# Patient Record
Sex: Female | Born: 1955
Health system: Southern US, Community
[De-identification: ages and names within clinical notes are randomized; demographics above are authoritative.]

## PROBLEM LIST (undated history)

## (undated) DIAGNOSIS — T753XXA Motion sickness, initial encounter: Secondary | ICD-10-CM

## (undated) DIAGNOSIS — R002 Palpitations: Secondary | ICD-10-CM

## (undated) DIAGNOSIS — M1990 Unspecified osteoarthritis, unspecified site: Secondary | ICD-10-CM

## (undated) DIAGNOSIS — M810 Age-related osteoporosis without current pathological fracture: Secondary | ICD-10-CM

## (undated) HISTORY — PX: COLONOSCOPY: SHX174

---
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
## 1985-02-13 HISTORY — PX: TUBAL LIGATION: SHX77

## 1998-08-19 ENCOUNTER — Other Ambulatory Visit: Admission: RE | Admit: 1998-08-19 | Discharge: 1998-08-19 | Payer: Self-pay | Admitting: Obstetrics and Gynecology

## 1999-09-01 ENCOUNTER — Other Ambulatory Visit: Admission: RE | Admit: 1999-09-01 | Discharge: 1999-09-01 | Payer: Self-pay | Admitting: Obstetrics and Gynecology

## 2000-11-07 ENCOUNTER — Other Ambulatory Visit: Admission: RE | Admit: 2000-11-07 | Discharge: 2000-11-07 | Payer: Self-pay | Admitting: Obstetrics and Gynecology

## 2001-09-03 ENCOUNTER — Other Ambulatory Visit: Admission: RE | Admit: 2001-09-03 | Discharge: 2001-09-03 | Payer: Self-pay | Admitting: Obstetrics and Gynecology

## 2002-12-17 ENCOUNTER — Other Ambulatory Visit: Admission: RE | Admit: 2002-12-17 | Discharge: 2002-12-17 | Payer: Self-pay | Admitting: Obstetrics and Gynecology

## 2004-02-29 ENCOUNTER — Other Ambulatory Visit: Admission: RE | Admit: 2004-02-29 | Discharge: 2004-02-29 | Payer: Self-pay | Admitting: Obstetrics and Gynecology

## 2004-10-25 ENCOUNTER — Other Ambulatory Visit: Admission: RE | Admit: 2004-10-25 | Discharge: 2004-10-25 | Payer: Self-pay | Admitting: Obstetrics and Gynecology

## 2006-11-01 ENCOUNTER — Ambulatory Visit: Payer: Self-pay | Admitting: Obstetrics & Gynecology

## 2012-06-27 ENCOUNTER — Ambulatory Visit: Payer: Self-pay | Admitting: Obstetrics and Gynecology

## 2013-12-01 ENCOUNTER — Ambulatory Visit: Payer: Self-pay | Admitting: Obstetrics and Gynecology

## 2013-12-02 ENCOUNTER — Encounter: Payer: Self-pay | Admitting: *Deleted

## 2013-12-17 ENCOUNTER — Encounter: Payer: Self-pay | Admitting: General Surgery

## 2013-12-17 ENCOUNTER — Ambulatory Visit (INDEPENDENT_AMBULATORY_CARE_PROVIDER_SITE_OTHER): Payer: BC Managed Care – PPO | Admitting: General Surgery

## 2013-12-17 VITALS — BP 116/68 | HR 72 | Resp 12 | Ht 61.0 in | Wt 123.0 lb

## 2013-12-17 DIAGNOSIS — N63 Unspecified lump in breast: Secondary | ICD-10-CM

## 2013-12-17 DIAGNOSIS — N6320 Unspecified lump in the left breast, unspecified quadrant: Secondary | ICD-10-CM

## 2013-12-17 NOTE — Progress Notes (Signed)
Patient ID: Natasha KocherNancy L Godsil, female   DOB: 08/24/1955, 58 y.o.   MRN: [id number]  Chief Complaint  Patient presents with  . Other    left breast cyst    HPI Natasha Kocherancy L Mccleod is a 58 y.o. female who presents for a breast evaluation. The most recent mammogram was done on 12/01/13 and left breast ultrasound was performed on 12/05/13.  Patient does not  perform regular self breast checks and gets regular mammograms done.   The patient has been making use of Activella to manage vasomotor symptoms with significant relief for the past year.  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  HPI  No past medical history on file.  Past Surgical History  Procedure Laterality Date  . Tubal ligation  1987  . Tonsillectomy  1972    No family history on file.  Social History History  Substance Use Topics  . Smoking status: Never Smoker   . Smokeless tobacco: Never Used  . Alcohol Use: 0.0 oz/week    0 Not specified per week    Allergies  Allergen Reactions  . Penicillins Hives    Current Outpatient Prescriptions  Medication Sig Dispense Refill  . Calcium Carbonate-Vit D-Min (CALCIUM 1200 PO) Take 1 capsule by mouth daily.    . Cholecalciferol (VITAMIN D3) 2000 UNITS CHEW Chew by mouth daily.    Marland Kitchen. co-enzyme Q-10 30 MG capsule Take 30 mg by mouth 3 (three) times daily.    . Estradiol-Norethindrone Acet (ACTIVELLA) 0.5-0.1 MG per tablet Take 1 tablet by mouth daily.    . Omega-3 Fatty Acids (FISH OIL) 1000 MG CAPS Take 1 capsule by mouth daily.    . Red Yeast Rice 600 MG CAPS Take 1 capsule by mouth daily.    . Vitamin D, Cholecalciferol, 1000 UNITS TABS Take by mouth daily.     No current facility-administered medications for this visit.    Review of Systems Review of Systems  Constitutional: Negative.   Respiratory: Negative.   Cardiovascular: Negative.     Blood pressure 116/68, pulse 72, resp. rate 12, height 5\' 1"  (1.549 m), weight 123 lb (55.792 kg).  Physical  Exam Physical Exam  Constitutional: She is oriented to person, place, and time. She appears well-developed and well-nourished.  Eyes: Conjunctivae are normal. No scleral icterus.  Neck: Neck supple.  Cardiovascular: Normal rate, regular rhythm and normal heart sounds.   Pulmonary/Chest: Effort normal and breath sounds normal. Right breast exhibits no inverted nipple, no mass, no nipple discharge, no skin change and no tenderness. Left breast exhibits no inverted nipple, no mass, no nipple discharge, no skin change and no tenderness. Breasts are asymmetrical ( left breast bigger than right ).  Lymphadenopathy:    She has no cervical adenopathy.    She has no axillary adenopathy.  Neurological: She is alert and oriented to person, place, and time.  Skin: Skin is warm and dry.    Data Reviewed [facility]2013 showed dense breast and a suggestion of a new nodule in the 3:00 position of the left breast.  Focal spot compression views and ultrasound of the left breast dated 12/01/2013 completed at [facility] showed a circumscribed, oval mass in the left breast. Ultrasound showed a 4 x 8 x 7 mm cystic structure with thin septations at the 3:00 position, 4 cm from the nipple felt to represent a complex cyst and corresponding to the mammographic abnormality. A follow up mammogram and ultrasound in 6  months were recommended.  The area is much better seen on ultrasound and on mammogram.  Assessment    Complex cyst on imaging, low suspicion for malignancy.     Plan    Options for management were reviewed: 1) aspiration to confirm ultrasound impression versus 2) 6 month follow-up exam as recommended by the radiology staff. At this time, the patient is comfortable with observation.  She was offered to have the study completed at the hospital with an office visit here to follow or to have the ultrasound completed in office. She has chosen the latter. Patient to  return in six month at which time a left breast ultrasound will be completed.   I see no contraindication of the continued use of her estrogen supplement as it has produced significant relief from vasomotor symptoms.   PCP:  Alyson ReedyKing, Crystal M    Phong Isenberg W 12/19/2013, 8:53 AM

## 2013-12-17 NOTE — Patient Instructions (Signed)
Patient to return in six month with a left breast ultrasound.

## 2013-12-19 DIAGNOSIS — N6320 Unspecified lump in the left breast, unspecified quadrant: Secondary | ICD-10-CM | POA: Insufficient documentation

## 2013-12-29 ENCOUNTER — Ambulatory Visit: Payer: Self-pay | Admitting: Obstetrics and Gynecology

## 2014-05-28 ENCOUNTER — Ambulatory Visit: Payer: Self-pay | Admitting: General Surgery

## 2014-06-02 ENCOUNTER — Encounter: Payer: Self-pay | Admitting: General Surgery

## 2014-06-02 ENCOUNTER — Ambulatory Visit (INDEPENDENT_AMBULATORY_CARE_PROVIDER_SITE_OTHER): Payer: BLUE CROSS/BLUE SHIELD | Admitting: General Surgery

## 2014-06-02 ENCOUNTER — Other Ambulatory Visit: Payer: BLUE CROSS/BLUE SHIELD

## 2014-06-02 VITALS — BP 130/72 | HR 76 | Resp 12 | Ht 61.0 in | Wt 134.0 lb

## 2014-06-02 DIAGNOSIS — N63 Unspecified lump in breast: Secondary | ICD-10-CM

## 2014-06-02 DIAGNOSIS — N6002 Solitary cyst of left breast: Secondary | ICD-10-CM

## 2014-06-02 DIAGNOSIS — N6320 Unspecified lump in the left breast, unspecified quadrant: Secondary | ICD-10-CM

## 2014-06-02 DIAGNOSIS — N6001 Solitary cyst of right breast: Secondary | ICD-10-CM | POA: Insufficient documentation

## 2014-06-02 NOTE — Progress Notes (Signed)
Patient ID: Natasha Andrews, female   DOB: 04/28/1955, 59 y.o.   MRN: [id number]  Chief Complaint  Patient presents with  . Follow-up    left breast mass    HPI Natasha Andrews is a 59 y.o. female here following up for left breast ultrasound of a nodular area identified on her fall 2015 mammograms. The patient reports no difficulties with her breasts. HPI  No past medical history on file.  Past Surgical History  Procedure Laterality Date  . Tubal ligation  1987  . Tonsillectomy  1972    No family history on file.  Social History History  Substance Use Topics  . Smoking status: Never Smoker   . Smokeless tobacco: Never Used  . Alcohol Use: 0.0 oz/week    0 Standard drinks or equivalent per week    Allergies  Allergen Reactions  . Penicillins Hives    Current Outpatient Prescriptions  Medication Sig Dispense Refill  . Calcium Carbonate-Vit D-Min (CALCIUM 1200 PO) Take 1 capsule by mouth daily.    . Cholecalciferol (VITAMIN D3) 2000 UNITS CHEW Chew by mouth daily.    Marland Kitchen co-enzyme Q-10 30 MG capsule Take 30 mg by mouth 3 (three) times daily.    . Estradiol-Norethindrone Acet (ACTIVELLA) 0.5-0.1 MG per tablet Take 1 tablet by mouth daily.    . Omega-3 Fatty Acids (FISH OIL) 1000 MG CAPS Take 1 capsule by mouth daily.    . Red Yeast Rice 600 MG CAPS Take 1 capsule by mouth daily.    . Vitamin D, Cholecalciferol, 1000 UNITS TABS Take by mouth daily.     No current facility-administered medications for this visit.    Review of Systems Review of Systems  Constitutional: Negative.   Respiratory: Negative.   Cardiovascular: Negative.     Blood pressure 130/72, pulse 76, resp. rate 12, height  (1.549 m), weight 134 lb (60.782 kg).  Physical Exam Physical Exam  Constitutional: She is oriented to person, place, and time. She appears well-developed and well-nourished.  Eyes: Conjunctivae are normal. No scleral icterus.  Neck: Neck supple.  Cardiovascular: Normal rate,  regular rhythm and normal heart sounds.   Pulmonary/Chest: Effort normal and breath sounds normal. Right breast exhibits no inverted nipple, no mass, no nipple discharge, no skin change and no tenderness. Left breast exhibits no inverted nipple, no mass, no nipple discharge, no skin change and no tenderness. Breasts are asymmetrical (left breast larger than right).  Lymphadenopathy:    She has no cervical adenopathy.    She has no axillary adenopathy.  Neurological: She is alert and oriented to person, place, and time.  Skin: Skin is warm and dry.    Data Review  Prior ultrasound in November 2015 had shown a 4 x 7 x 8 cm irregular multilobulated nodule thought to represent a complex cyst.  Ultrasound examination of the left breast at the 3:00 position, 4 cm from the nipple showed a multilobulated complex cystic structure now measuring 0.6 x 0.78 x 0.8 cm. With the modest increase in size over time aspiration was recommended and accepted. Using 1 mL of 1% plain Xylocaine the area was aspirated with complete resolution. Due to the hyperechoic tissue surrounding the area slides 4 were prepared for cytologic review. BI-RADS-3.  Assessment    benign breast exam, complex cyst resolved on aspiration.    Plan    The patient will be notified when cytologic review is completed.   We will plan for follow-up screening mammograms in  6 months at [facility].    PCP: Thad RangerKing, Crystal NP   Earline MayotteByrnett, Daya Dutt W 06/02/2014, 8:27 PM

## 2014-06-02 NOTE — Patient Instructions (Signed)
Continue self breast exams. Call office for any new breast issues or concerns. 

## 2014-06-03 NOTE — Addendum Note (Signed)
Addended by: Currie ParisHATCH, Caidence Kaseman M on: 06/03/2014 11:15 AM   Modules accepted: Level of Service

## 2014-06-05 LAB — FINE-NEEDLE ASPIRATION

## 2014-06-08 ENCOUNTER — Telehealth: Payer: Self-pay | Admitting: General Surgery

## 2014-06-08 NOTE — Telephone Encounter (Signed)
The patient was notified that the cytology reports from last week cyst aspiration showed some atypical cells. I suspect that these are more likely inflammatory and anything else based on the hyperemic tissue around the cyst itself. We'll need to do a core biopsy of this area, and I proposed that we do this in about 3-4 weeks. We'll need to have a tiny bit of fluid reaccumulated to see where the area was, as there was complete resolution on aspiration.  She is comfortable with the idea and she'll be contacted for follow-up.

## 2014-06-30 ENCOUNTER — Other Ambulatory Visit: Payer: BLUE CROSS/BLUE SHIELD

## 2014-06-30 ENCOUNTER — Ambulatory Visit (INDEPENDENT_AMBULATORY_CARE_PROVIDER_SITE_OTHER): Payer: BLUE CROSS/BLUE SHIELD | Admitting: General Surgery

## 2014-06-30 ENCOUNTER — Encounter: Payer: Self-pay | Admitting: General Surgery

## 2014-06-30 VITALS — BP 124/66 | HR 80 | Resp 12 | Ht 62.0 in | Wt 122.0 lb

## 2014-06-30 DIAGNOSIS — N6320 Unspecified lump in the left breast, unspecified quadrant: Secondary | ICD-10-CM

## 2014-06-30 DIAGNOSIS — N63 Unspecified lump in breast: Secondary | ICD-10-CM

## 2014-06-30 HISTORY — PX: BREAST SURGERY: SHX581

## 2014-06-30 HISTORY — PX: BREAST BIOPSY: SHX20

## 2014-06-30 NOTE — Patient Instructions (Signed)

## 2014-06-30 NOTE — Progress Notes (Signed)
Patient ID: Natasha KocherNancy L Andrews, female   DOB: 08/16/1955, 59 y.o.   MRN: [id number]  Chief Complaint  Patient presents with  . Other    Vaccum biopsy of left breast    HPI Natasha Kocherancy L Reddix is a 59 y.o. female here today for left breast biopsy. HPI   No past medical history on file.  Past Surgical History  Procedure Laterality Date  . Tubal ligation  1987  . Tonsillectomy  1972    No family history on file.  Social History History  Substance Use Topics  . Smoking status: Never Smoker   . Smokeless tobacco: Never Used  . Alcohol Use: 0.0 oz/week    0 Standard drinks or equivalent per week    Allergies  Allergen Reactions  . Penicillins Hives    Current Outpatient Prescriptions  Medication Sig Dispense Refill  . ALPRAZolam (XANAX) 0.25 MG tablet Take 0.25 mg by mouth 2 (two) times daily as needed.   0  . Calcium Carbonate-Vit D-Min (CALCIUM 1200 PO) Take 1 capsule by mouth daily.    . Cholecalciferol (VITAMIN D3) 2000 UNITS CHEW Chew by mouth daily.    Marland Kitchen. co-enzyme Q-10 30 MG capsule Take 30 mg by mouth 3 (three) times daily.    . Estradiol-Norethindrone Acet (ACTIVELLA) 0.5-0.1 MG per tablet Take 1 tablet by mouth daily.    . Omega-3 Fatty Acids (FISH OIL) 1000 MG CAPS Take 1 capsule by mouth daily.    . Red Yeast Rice 600 MG CAPS Take 1 capsule by mouth daily.     No current facility-administered medications for this visit.    Review of Systems Review of Systems  Constitutional: Negative.   Respiratory: Negative.   Cardiovascular: Negative.     Blood pressure 124/66, pulse 80, resp. rate 12, height 5\' 2"  (1.575 m), weight 122 lb (55.339 kg).  Physical Exam Physical Exam  Pulmonary/Chest:    Genitourinary: No breast swelling, tenderness or discharge. Pelvic exam was performed with patient supine.    Data Reviewed Ultrasound examination of the left breast at the 3:00 position, 4 cm from the nipple had previously shown a multilobulated complex cystic structure now  measuring 0.6 x 0.78 x 0.8 cm. Cytology from this aspiration had shown atypical cells.  Vacuum biopsy was completed at the 3:00 position of the left breast 4 cm from the nipple.  10 mL of 0.5% Xylocaine with 0.25% Marcaine with 1-200,000 units of epinephrine was utilized well tolerated.  Under ultrasound guidance 12 core samples were obtained with complete resolution of the hyperechoic tissue surrounding the original cystic lesion. A postbiopsy clip was placed. Scant bleeding was noted. Skin defect was closed with benzoin and Steri-Strip. Telfa and Tegaderm dressing applied. Procedure was well tolerated.  Assessment    Atypical ductal cells on aspiration.     Plan    The patient will be contacted when biopsy results are available. Postbiopsy wound care reviewed with the patient, written instructions provided.        PCP: No PCP REF: Diego CoryCarrie Klett,MD  Garritt Molyneux W 06/30/2014, 9:56 AM

## 2014-07-01 ENCOUNTER — Telehealth: Payer: Self-pay | Admitting: *Deleted

## 2014-07-01 NOTE — Telephone Encounter (Signed)
Phone call from Dr Luisa HartPatrick, pathology, recent left breast biopsy showed fibrocystic changes with calcifications.

## 2014-07-01 NOTE — Telephone Encounter (Signed)
Notified patient as instructed, patient pleased. Discussed follow-up appointments, patient agrees  

## 2014-07-07 ENCOUNTER — Ambulatory Visit: Payer: BLUE CROSS/BLUE SHIELD | Admitting: *Deleted

## 2014-07-07 DIAGNOSIS — N6320 Unspecified lump in the left breast, unspecified quadrant: Secondary | ICD-10-CM

## 2014-07-07 NOTE — Progress Notes (Signed)
Patient came in today for a wound check/post left breast biopsy.  The wound is clean, with no signs of infection noted. Follow up as scheduled.  

## 2014-12-09 ENCOUNTER — Ambulatory Visit: Payer: BLUE CROSS/BLUE SHIELD | Admitting: General Surgery

## 2014-12-16 ENCOUNTER — Ambulatory Visit: Payer: BLUE CROSS/BLUE SHIELD | Admitting: General Surgery

## 2014-12-30 ENCOUNTER — Ambulatory Visit: Payer: BLUE CROSS/BLUE SHIELD | Admitting: General Surgery

## 2015-01-13 ENCOUNTER — Encounter: Payer: Self-pay | Admitting: General Surgery

## 2015-01-13 ENCOUNTER — Ambulatory Visit (INDEPENDENT_AMBULATORY_CARE_PROVIDER_SITE_OTHER): Payer: BLUE CROSS/BLUE SHIELD | Admitting: General Surgery

## 2015-01-13 VITALS — BP 98/58 | HR 72 | Resp 12 | Ht 64.0 in | Wt 121.0 lb

## 2015-01-13 DIAGNOSIS — N6002 Solitary cyst of left breast: Secondary | ICD-10-CM

## 2015-01-13 DIAGNOSIS — N63 Unspecified lump in breast: Secondary | ICD-10-CM

## 2015-01-13 DIAGNOSIS — N6320 Unspecified lump in the left breast, unspecified quadrant: Secondary | ICD-10-CM

## 2015-01-13 NOTE — Progress Notes (Signed)
Patient ID: Natasha Andrews, female   DOB: 06/02/1955, 59 y.o.   MRN: [id number]  Chief Complaint  Patient presents with  . Follow-up    mammogram    HPI Natasha Andrews is a 59 y.o. female who presents for a breast evaluation. The most recent mammogram was done on  11/27/14.  Patient does perform regular self breast checks and gets regular mammograms done.    HPI  No past medical history on file.  Past Surgical History  Procedure Laterality Date  . Tubal ligation  1987  . Tonsillectomy  1972  . Breast surgery Left 06/30/2014    Vacuum assisted biopsy, fibrocystic changes.    No family history on file.  Social History Social History  Substance Use Topics  . Smoking status: Never Smoker   . Smokeless tobacco: Never Used  . Alcohol Use: 0.0 oz/week    0 Standard drinks or equivalent per week    Allergies  Allergen Reactions  . Penicillins Hives    Current Outpatient Prescriptions  Medication Sig Dispense Refill  . ALPRAZolam (XANAX) 0.25 MG tablet Take 0.25 mg by mouth 2 (two) times daily as needed.   0  . Calcium Carbonate-Vit D-Min (CALCIUM 1200 PO) Take 1 capsule by mouth daily.    . Cholecalciferol (VITAMIN D3) 2000 UNITS CHEW Chew by mouth daily.    Marland Kitchen. co-enzyme Q-10 30 MG capsule Take 30 mg by mouth 3 (three) times daily.    . Estradiol-Norethindrone Acet (ACTIVELLA) 0.5-0.1 MG per tablet Take 1 tablet by mouth daily.    . Omega-3 Fatty Acids (FISH OIL) 1000 MG CAPS Take 1 capsule by mouth daily.    . Red Yeast Rice 600 MG CAPS Take 1 capsule by mouth daily.     No current facility-administered medications for this visit.    Review of Systems Review of Systems  Constitutional: Negative.   Respiratory: Negative.   Cardiovascular: Negative.     Blood pressure 98/58, pulse 72, resp. rate 12, height 5\' 4"  (1.626 m), weight 121 lb (54.885 kg).  Physical Exam Physical Exam  Constitutional: She is oriented to person, place, and time. She appears well-developed  and well-nourished.  Eyes: Conjunctivae are normal. No scleral icterus.  Neck: Neck supple.  Cardiovascular: Normal rate, regular rhythm and normal heart sounds.   Pulmonary/Chest: Effort normal and breath sounds normal. Right breast exhibits no inverted nipple, no mass, no nipple discharge, no skin change and no tenderness. Left breast exhibits no inverted nipple, no mass, no nipple discharge, no skin change and no tenderness.  Lymphadenopathy:    She has no cervical adenopathy.    She has no axillary adenopathy.  Neurological: She is alert and oriented to person, place, and time.  Skin: Skin is warm and dry.    Data Reviewed Diagnosis Breast, left, needle core biopsy, 3 o'clock - FIBROCYSTIC CHANGES. - NO EVIDENCE OF MALIGNANCY. - SEE MICROSCOPIC DESCRIPTION. Microscopic Comment The results were called to [facility] on 07/01/2014. (JDP:kh 07/01/14) Jimmy PicketJOHN PATRICK MD Pathologist, Electronic Signature (Case signed 07/01/2014)  Bilateral mammograms dated 11/27/2014 were reviewed. The previous abnormality is resolved, biopsy clip and placed. Pathology benign as noted above. BIRAD-1.Specimen Gross  Assessment    Benign breast exam.     Plan    The patient should continue annual screening mammograms with her OB/GYN provider.     Patient to return as needed. PCP:  No Pcp   Earline MayotteByrnett, Jodel Mayhall W 01/13/2015, 3:12 PM

## 2015-01-13 NOTE — Patient Instructions (Signed)
Patient to return as needed. Continue self breast exams. Call office for any new breast issues or concerns.'  

## 2015-12-08 ENCOUNTER — Telehealth: Payer: Self-pay | Admitting: Gastroenterology

## 2015-12-08 DIAGNOSIS — M810 Age-related osteoporosis without current pathological fracture: Secondary | ICD-10-CM | POA: Diagnosis not present

## 2015-12-08 DIAGNOSIS — Z1329 Encounter for screening for other suspected endocrine disorder: Secondary | ICD-10-CM | POA: Diagnosis not present

## 2015-12-08 DIAGNOSIS — Z13220 Encounter for screening for lipoid disorders: Secondary | ICD-10-CM | POA: Diagnosis not present

## 2015-12-08 DIAGNOSIS — Z01419 Encounter for gynecological examination (general) (routine) without abnormal findings: Secondary | ICD-10-CM | POA: Diagnosis not present

## 2015-12-08 LAB — HM PAP SMEAR: HM Pap smear: NEGATIVE

## 2015-12-08 NOTE — Telephone Encounter (Signed)
colonoscopy

## 2015-12-13 ENCOUNTER — Other Ambulatory Visit: Payer: Self-pay

## 2015-12-13 NOTE — Telephone Encounter (Signed)
Screening Colonoscopy Z12.11 02/11/16 [facility] Dr. Rickey BarbaraWohl BCBS Pre cert is not required

## 2015-12-13 NOTE — Telephone Encounter (Signed)
Gastroenterology Pre-Procedure Review  Request Date: 02/11/2016 Requesting Physician:   PATIENT REVIEW QUESTIONS: The patient responded to the following health history questions as indicated:    1. Are you having any GI issues? no 2. Do you have a personal history of Polyps? no 3. Do you have a family history of Colon Cancer or Polyps? no 4. Diabetes Mellitus? no 5. Joint replacements in the past 12 months?no 6. Major health problems in the past 3 months?no 7. Any artificial heart valves, MVP, or defibrillator?no    MEDICATIONS & ALLERGIES:    Patient reports the following regarding taking any anticoagulation/antiplatelet therapy:   Plavix, Coumadin, Eliquis, Xarelto, Lovenox, Pradaxa, Brilinta, or Effient? no Aspirin? no  Patient confirms/reports the following medications:  Current Outpatient Prescriptions  Medication Sig Dispense Refill  . Calcium Carbonate-Vit D-Min (CALCIUM 1200 PO) Take 1 capsule by mouth daily.    . Cholecalciferol (VITAMIN D3) 2000 UNITS CHEW Chew by mouth daily.    Marland Kitchen. co-enzyme Q-10 30 MG capsule Take 30 mg by mouth 3 (three) times daily.    . Estradiol-Norethindrone Acet (ACTIVELLA) 0.5-0.1 MG per tablet Take 1 tablet by mouth daily.    . Omega-3 Fatty Acids (FISH OIL) 1000 MG CAPS Take 1 capsule by mouth daily.    . Red Yeast Rice 600 MG CAPS Take 1 capsule by mouth daily.    . valACYclovir (VALTREX) 500 MG tablet   4   No current facility-administered medications for this visit.     Patient confirms/reports the following allergies:  Allergies  Allergen Reactions  . Penicillins Hives    No orders of the defined types were placed in this encounter.   AUTHORIZATION INFORMATION Primary Insurance: 1D#: Group #:  Secondary Insurance: 1D#: Group #:  SCHEDULE INFORMATION: Date: 02/11/2016 Time: Location: [facility]

## 2016-01-25 ENCOUNTER — Telehealth: Payer: Self-pay | Admitting: Gastroenterology

## 2016-01-25 NOTE — Telephone Encounter (Signed)
Ms. Natasha Andrews called saying she's scheduled for a Colonoscopy on December 29th, 2017 and she wants to reschedule it to the second week in January. Please give her a phone call regarding this.  Pt's ph# [phone] Thank you.

## 2016-01-26 NOTE — Telephone Encounter (Signed)
Colonoscopy rescheduled to Jan 12th. [facility] notified.

## 2016-02-17 ENCOUNTER — Encounter: Payer: Self-pay | Admitting: *Deleted

## 2016-02-24 NOTE — Discharge Instructions (Signed)

## 2016-02-25 ENCOUNTER — Ambulatory Visit: Payer: BLUE CROSS/BLUE SHIELD | Admitting: Anesthesiology

## 2016-02-25 ENCOUNTER — Encounter
Admission: RE | Disposition: A | Discharge status: Home / Self Care | Payer: Self-pay | Source: Ambulatory Visit | Attending: Gastroenterology

## 2016-02-25 ENCOUNTER — Ambulatory Visit
Admission: RE | Admit: 2016-02-25 | Discharge: 2016-02-25 | Disposition: A | Discharge status: Home / Self Care | Payer: BLUE CROSS/BLUE SHIELD | Source: Ambulatory Visit | Attending: Gastroenterology | Admitting: Gastroenterology

## 2016-02-25 DIAGNOSIS — M1990 Unspecified osteoarthritis, unspecified site: Secondary | ICD-10-CM | POA: Insufficient documentation

## 2016-02-25 DIAGNOSIS — Z1211 Encounter for screening for malignant neoplasm of colon: Secondary | ICD-10-CM | POA: Diagnosis not present

## 2016-02-25 DIAGNOSIS — K640 First degree hemorrhoids: Secondary | ICD-10-CM | POA: Insufficient documentation

## 2016-02-25 DIAGNOSIS — Z79899 Other long term (current) drug therapy: Secondary | ICD-10-CM | POA: Insufficient documentation

## 2016-02-25 DIAGNOSIS — M810 Age-related osteoporosis without current pathological fracture: Secondary | ICD-10-CM | POA: Diagnosis not present

## 2016-02-25 HISTORY — PX: COLONOSCOPY WITH PROPOFOL: SHX5780

## 2016-02-25 HISTORY — DX: Palpitations: R00.2

## 2016-02-25 HISTORY — DX: Unspecified osteoarthritis, unspecified site: M19.90

## 2016-02-25 HISTORY — DX: Motion sickness, initial encounter: T75.3XXA

## 2016-02-25 HISTORY — DX: Age-related osteoporosis without current pathological fracture: M81.0

## 2016-02-25 SURGERY — COLONOSCOPY WITH PROPOFOL
Anesthesia: Monitor Anesthesia Care | Wound class: Contaminated

## 2016-02-25 MED ORDER — STERILE WATER FOR IRRIGATION IR SOLN
Status: DC | PRN
  Administered 2016-02-25: 09:00:00

## 2016-02-25 MED ORDER — LIDOCAINE HCL (CARDIAC) 20 MG/ML IV SOLN
INTRAVENOUS | Status: DC | PRN
  Administered 2016-02-25: 40 mg via INTRAVENOUS

## 2016-02-25 MED ORDER — PROPOFOL 10 MG/ML IV BOLUS
INTRAVENOUS | Status: DC | PRN
  Administered 2016-02-25: 40 mg via INTRAVENOUS
  Administered 2016-02-25: 80 mg via INTRAVENOUS

## 2016-02-25 MED ORDER — LACTATED RINGERS IV SOLN
INTRAVENOUS | Status: DC
  Administered 2016-02-25: 08:00:00 via INTRAVENOUS

## 2016-02-25 SURGICAL SUPPLY — 23 items

## 2016-02-25 NOTE — Anesthesia Postprocedure Evaluation (Addendum)
Anesthesia Post Note  Patient: Natasha Andrews  Procedure(s) Performed: Procedure(s) (LRB): COLONOSCOPY WITH PROPOFOL (N/A)  Patient location during evaluation: PACU Anesthesia Type: MAC Level of consciousness: awake Pain management: pain level controlled Vital Signs Assessment: post-procedure vital signs reviewed and stable Respiratory status: spontaneous breathing Cardiovascular status: blood pressure returned to baseline Postop Assessment: no headache Anesthetic complications: no    Jaci Standard, III,  Pierrette Scheu D

## 2016-02-25 NOTE — Anesthesia Procedure Notes (Signed)
Procedure Name: MAC Date/Time: 02/25/2016 8:23 AM Performed by: Janna Arch Pre-anesthesia Checklist: Patient identified, Emergency Drugs available, Suction available and Patient being monitored Patient Re-evaluated:Patient Re-evaluated prior to inductionOxygen Delivery Method: Nasal cannula

## 2016-02-25 NOTE — Op Note (Addendum)
[facility] Gastroenterology Patient Name: Natasha Andrews Procedure Date: 02/25/2016 8:21 AM MRN: [id number] Account #: [id number] Date of Birth: January 12, 1956 Admit Type: Outpatient Age: 61 Room: [facility] OR ROOM 01 Gender: Female Note Status: Finalized Procedure:            Colonoscopy Indications:          Screening for colorectal malignant neoplasm Providers:            Midge Minium MD, MD Medicines:            Propofol per Anesthesia Complications:        No immediate complications. Procedure:            Pre-Anesthesia Assessment:                       - Prior to the procedure, a History and Physical was                        performed, and patient medications and allergies were                        reviewed. The patient's tolerance of previous                        anesthesia was also reviewed. The risks and benefits of                        the procedure and the sedation options and risks were                        discussed with the patient. All questions were                        answered, and informed consent was obtained. Prior                        Anticoagulants: The patient has taken no previous                        anticoagulant or antiplatelet agents. ASA Grade                        Assessment: II - A patient with mild systemic disease.                        After reviewing the risks and benefits, the patient was                        deemed in satisfactory condition to undergo the                        procedure.                       After obtaining informed consent, the colonoscope was                        passed under direct vision. Throughout the procedure,                        the patient's blood pressure,  pulse, and oxygen                        saturations were monitored continuously. The was                        introduced through the anus and advanced to the the                        cecum, identified by appendiceal orifice and  ileocecal                        valve. The colonoscopy was performed without                        difficulty. The patient tolerated the procedure well. Findings:      The perianal and digital rectal examinations were normal.      Non-bleeding internal hemorrhoids were found during retroflexion. The       hemorrhoids were Grade I (internal hemorrhoids that do not prolapse). Impression:           - Non-bleeding internal hemorrhoids.                       - No specimens collected. Recommendation:       - Discharge patient to home.                       - Resume previous diet.                       - Repeat colonoscopy in 10 years for screening unless                        any change in family history or lower GI problems. Procedure Code(s):    --- Professional ---                       [phone], Colonoscopy, flexible; diagnostic, including                        collection of specimen(s) by brushing or washing, when                        performed (separate procedure) Diagnosis Code(s):    --- Professional ---                       Z12.11, Encounter for screening for malignant neoplasm                        of colon CPT copyright 2016 [employer]. All rights reserved. The codes documented in this report are preliminary and upon coder review may  be revised to meet current compliance requirements. Midge Miniumarren Madge Therrien MD, MD 02/25/2016 8:40:21 AM This report has been signed electronically. Number of Addenda: 0 Note Initiated On: 02/25/2016 8:21 AM Scope Withdrawal Time: 0 hours 6 minutes 0 seconds  Total Procedure Duration: 0 hours 10 minutes 10 seconds       [facility]

## 2016-02-25 NOTE — Anesthesia Preprocedure Evaluation (Signed)
Anesthesia Evaluation  Patient identified by MRN, date of birth, ID band Patient awake    Reviewed: Allergy & Precautions, H&P , NPO status , Patient's Chart, lab work & pertinent test results  Airway Mallampati: II  TM Distance: >3 FB Neck ROM: full    Dental no notable dental hx.    Pulmonary neg pulmonary ROS,    Pulmonary exam normal        Cardiovascular negative cardio ROS Normal cardiovascular exam     Neuro/Psych    GI/Hepatic negative GI ROS, Neg liver ROS,   Endo/Other  negative endocrine ROS  Renal/GU negative Renal ROS     Musculoskeletal   Abdominal   Peds  Hematology negative hematology ROS (+)   Anesthesia Other Findings   Reproductive/Obstetrics                             Anesthesia Physical Anesthesia Plan  ASA: I  Anesthesia Plan: MAC   Post-op Pain Management:    Induction:   Airway Management Planned:   Additional Equipment:   Intra-op Plan:   Post-operative Plan:   Informed Consent: I have reviewed the patients History and Physical, chart, labs and discussed the procedure including the risks, benefits and alternatives for the proposed anesthesia with the patient or authorized representative who has indicated his/her understanding and acceptance.     Plan Discussed with:   Anesthesia Plan Comments:         Anesthesia Quick Evaluation  

## 2016-02-25 NOTE — Transfer of Care (Signed)
Immediate Anesthesia Transfer of Care Note  Patient: Natasha Andrews  Procedure(s) Performed: Procedure(s): COLONOSCOPY WITH PROPOFOL (N/A)  Patient Location: PACU  Anesthesia Type: MAC  Level of Consciousness: awake, alert  and patient cooperative  Airway and Oxygen Therapy: Patient Spontanous Breathing and Patient connected to supplemental oxygen  Post-op Assessment: Post-op Vital signs reviewed, Patient's Cardiovascular Status Stable, Respiratory Function Stable, Patent Airway and No signs of Nausea or vomiting  Post-op Vital Signs: Reviewed and stable  Complications: No apparent anesthesia complications

## 2016-02-25 NOTE — H&P (Signed)
  Midge Miniumarren Skylah Delauter, MD [facility] [address] [place], [place] [address] Phone: [phone] Fax : [phone]  Primary Care Physician:  No PCP Per Patient Primary Gastroenterologist:  Dr. Servando SnareWohl  Pre-Procedure History & Physical: HPI:  Natasha Andrews is a 61 y.o. female is here for a screening colonoscopy.   Past Medical History:  Diagnosis Date  . Arthritis    hips, legs  . Motion sickness    boats  . Osteoporosis   . Palpitations    with too much caffeine    Past Surgical History:  Procedure Laterality Date  . BREAST SURGERY Left 06/30/2014   Vacuum assisted biopsy, fibrocystic changes.  . COLONOSCOPY    . TONSILLECTOMY  1972  . TUBAL LIGATION  1987    Prior to Admission medications   Medication Sig Start Date End Date Taking? Authorizing Provider  Calcium Carbonate-Vit D-Min (CALCIUM 1200 PO) Take 1 capsule by mouth daily.   Yes Historical Provider, MD  Cholecalciferol (VITAMIN D3) 2000 UNITS CHEW Chew by mouth daily.   Yes Historical Provider, MD  co-enzyme Q-10 30 MG capsule Take 30 mg by mouth 3 (three) times daily.   Yes Historical Provider, MD  Estradiol-Norethindrone Acet (ACTIVELLA) 0.5-0.1 MG per tablet Take 1 tablet by mouth daily.   Yes Historical Provider, MD  Multiple Vitamin (MULTIVITAMIN) tablet Take 1 tablet by mouth daily.   Yes Historical Provider, MD  Omega-3 Fatty Acids (FISH OIL) 1000 MG CAPS Take 1 capsule by mouth daily.   Yes Historical Provider, MD  Red Yeast Rice 600 MG CAPS Take 1 capsule by mouth daily.   Yes Historical Provider, MD  valACYclovir (VALTREX) 500 MG tablet  11/19/15  Yes Historical Provider, MD    Allergies as of 12/13/2015 - Review Complete 12/13/2015  Allergen Reaction Noted  . Penicillins Hives 12/17/2013    History reviewed. No pertinent family history.  Social History   Social History  . Marital status: Married    Spouse name: N/A  . Number of children: N/A  . Years of education: N/A   Occupational History  .  Not on file.   Social History Main Topics  . Smoking status: Never Smoker  . Smokeless tobacco: Never Used  . Alcohol use 1.8 oz/week    3 Cans of beer per week  . Drug use: No  . Sexual activity: Not on file   Other Topics Concern  . Not on file   Social History Narrative  . No narrative on file    Review of Systems: See HPI, otherwise negative ROS  Physical Exam: BP 111/64   Pulse 68   Temp 98.6 F (37 C) (Tympanic)   Resp 16   Ht 5\' 2"  (1.575 m)   Wt 115 lb (52.2 kg)   BMI 21.03 kg/m  General:   Alert,  pleasant and cooperative in NAD Head:  Normocephalic and atraumatic. Neck:  Supple; no masses or thyromegaly. Lungs:  Clear throughout to auscultation.    Heart:  Regular rate and rhythm. Abdomen:  Soft, nontender and nondistended. Normal bowel sounds, without guarding, and without rebound.   Neurologic:  Alert and  oriented x4;  grossly normal neurologically.  Impression/Plan: Natasha Andrews is now here to undergo a screening colonoscopy.  Risks, benefits, and alternatives regarding colonoscopy have been reviewed with the patient.  Questions have been answered.  All parties agreeable.

## 2016-02-28 ENCOUNTER — Encounter: Payer: Self-pay | Admitting: Gastroenterology

## 2016-05-30 ENCOUNTER — Encounter: Payer: Self-pay | Admitting: Obstetrics and Gynecology

## 2016-05-31 ENCOUNTER — Encounter: Payer: Self-pay | Admitting: Obstetrics and Gynecology

## 2016-05-31 ENCOUNTER — Ambulatory Visit (INDEPENDENT_AMBULATORY_CARE_PROVIDER_SITE_OTHER): Payer: BLUE CROSS/BLUE SHIELD | Admitting: Obstetrics and Gynecology

## 2016-05-31 VITALS — BP 118/66 | HR 85 | Ht 61.0 in | Wt 120.0 lb

## 2016-05-31 DIAGNOSIS — R102 Pelvic and perineal pain: Secondary | ICD-10-CM

## 2016-05-31 DIAGNOSIS — R350 Frequency of micturition: Secondary | ICD-10-CM | POA: Diagnosis not present

## 2016-05-31 LAB — POCT URINALYSIS DIPSTICK
Bilirubin, UA: NEGATIVE
Blood, UA: NEGATIVE
Glucose, UA: NEGATIVE
Ketones, UA: NEGATIVE
Leukocytes, UA: NEGATIVE
Nitrite, UA: NEGATIVE
Protein, UA: NEGATIVE
Spec Grav, UA: 1.02 (ref 1.010–1.025)
Urobilinogen, UA: NEGATIVE E.U./dL — AB
pH, UA: 7 (ref 5.0–8.0)

## 2016-05-31 NOTE — Patient Instructions (Signed)
Pelvic Organ Prolapse Pelvic organ prolapse is the stretching, bulging, or dropping of pelvic organs into an abnormal position. It happens when the muscles and tissues that surround and support pelvic structures are stretched or weak. Pelvic organ prolapse can involve:  Vagina (vaginal prolapse).  Uterus (uterine prolapse).  Bladder (cystocele).  Rectum (rectocele).  Intestines (enterocele). When organs other than the vagina are involved, they often bulge into the vagina or protrude from the vagina, depending on how severe the prolapse is. What are the causes? Causes of this condition include:  Pregnancy, labor, and childbirth.  Long-lasting (chronic) cough.  Chronic constipation.  Obesity.  Past pelvic surgery.  Aging. During and after menopause, a decreased production of the hormone estrogen can weaken pelvic ligaments and muscles.  Consistently lifting more than 50 lb (23 kg).  Buildup of fluid in the abdomen due to certain diseases and other conditions. What are the signs or symptoms? Symptoms of this condition include:  Loss of bladder control when you cough, sneeze, strain, and exercise (stress incontinence). This may be worse immediately following childbirth, and it may gradually improve over time.  Feeling pressure in your pelvis or vagina. This pressure may increase when you cough or when you are having a bowel movement.  A bulge that protrudes from the opening of your vagina or against your vaginal wall. If your uterus protrudes through the opening of your vagina and rubs against your clothing, you may also experience soreness, ulcers, infection, pain, and bleeding.  Increased effort to have a bowel movement or urinate.  Pain in your low back.  Pain, discomfort, or disinterest in sexual intercourse.  Repeated bladder infections (urinary tract infections).  Difficulty inserting or inability to insert a tampon or applicator. In some people, this condition does  not cause any symptoms. How is this diagnosed? Your health care provider may perform an internal and external vaginal and rectal exam. During the exam, you may be asked to cough and strain while you are lying down, sitting, and standing up. Your health care provider will determine if other tests are required, such as bladder function tests. How is this treated? In most cases, this condition needs to be treated only if it produces symptoms. No treatment is guaranteed to correct the prolapse or relieve the symptoms completely. Treatment may include:  Lifestyle changes, such as:  Avoiding drinking beverages that contain caffeine.  Increasing your intake of high-fiber foods. This can help to decrease constipation and straining during bowel movements.  Emptying your bladder at scheduled times (bladder training therapy). This can help to reduce or avoid urinary incontinence.  Losing weight if you are overweight or obese.  Estrogen. Estrogen may help mild prolapse by increasing the strength and tone of pelvic floor muscles.  Kegel exercises. These may help mild cases of prolapse by strengthening and tightening the muscles of the pelvic floor.  Pessary insertion. A pessary is a soft, flexible device that is placed into your vagina by your health care provider to help support the vaginal walls and keep pelvic organs in place.  Surgery. This is often the only form of treatment for severe prolapse. Different types of surgeries are available. Follow these instructions at home:  Wear a sanitary pad or absorbent product if you have urinary incontinence.  Avoid heavy lifting and straining with exercise and work. Do not hold your breath when you perform mild to moderate lifting and exercise activities. Limit your activities as directed by your health care provider.  Take medicines   only as directed by your health care provider.  Perform Kegel exercises as directed by your health care provider.  If  you have a pessary, take care of it as directed by your health care provider. Contact a health care provider if:  Your symptoms interfere with your daily activities or sex life.  You need medicine to help with the discomfort.  You notice bleeding from the vagina that is not related to your period.  You have a fever.  You have pain or bleeding when you urinate.  You have bleeding when you have a bowel movement.  You lose urine when you have sex.  You have chronic constipation.  You have a pessary that falls out.  You have vaginal discharge that has a bad smell.  You have low abdominal pain or cramping that is unusual for you. This information is not intended to replace advice given to you by your health care provider. Make sure you discuss any questions you have with your health care provider. Document Released: 08/27/2013 Document Revised: 07/08/2015 Document Reviewed: 04/14/2013 Elsevier Interactive Patient Education  2017 [employer]vier [employer]  

## 2016-05-31 NOTE — Progress Notes (Signed)
Obstetrics & Gynecology Office Visit   Chief Complaint:  Chief Complaint  Patient presents with  . Bladder Prolapse    abdominal pressure    History of Present Illness: 61 year old female presenting with symptoms of abdominal pressure.  Denies bloating or early satiety.  No urge incontinence or stress incontinence.  Denies the need for double voiding.  Does not have to splint or reduce and vaginal prolapse to facilitate micturition or defecation.  She denies constipation, chronic cough.  Does get up at least twice a night to void.  Denies above normal daily fluid intake, no significant caffeine intake.  No vaginal bleeding.     Review of Systems: Review of Systems  Constitutional: Negative for chills and fever.  HENT: Negative for congestion.   Respiratory: Negative for cough and shortness of breath.   Cardiovascular: Negative for chest pain and palpitations.  Gastrointestinal: Negative for abdominal pain, constipation, diarrhea, heartburn, nausea and vomiting.  Genitourinary: Negative for dysuria, flank pain, frequency, hematuria and urgency.  Skin: Negative for itching and rash.  Neurological: Negative for dizziness and headaches.  Endo/Heme/Allergies: Negative for polydipsia.  Psychiatric/Behavioral: Negative for depression.    Past Medical History:  Past Medical History:  Diagnosis Date  . Arthritis    hips, legs  . Motion sickness    boats  . Osteoporosis   . Palpitations    with too much caffeine    Past Surgical History:  Past Surgical History:  Procedure Laterality Date  . BREAST SURGERY Left 06/30/2014   Vacuum assisted biopsy, fibrocystic changes.  . COLONOSCOPY    . COLONOSCOPY WITH PROPOFOL N/A 02/25/2016   Procedure: COLONOSCOPY WITH PROPOFOL;  Surgeon: Midge Minium, MD;  Location: [facility];  Service: Endoscopy;  Laterality: N/A;  . TONSILLECTOMY  1972  . TUBAL LIGATION  1987    Gynecologic History: No LMP recorded. Patient is  postmenopausal.  Obstetric History: G1P1001  Family History:  Family History  Problem Relation Age of Onset  . Diabetes Mother     Type 2  . Hypertension Mother   . Heart disease Mother   . Lung cancer Brother   . Thyroid cancer Paternal Grandmother     Benign    Social History:  Social History   Social History  . Marital status: Married    Spouse name: N/A  . Number of children: N/A  . Years of education: N/A   Occupational History  . Not on file.   Social History Main Topics  . Smoking status: Never Smoker  . Smokeless tobacco: Never Used  . Alcohol use 1.8 oz/week    3 Cans of beer per week  . Drug use: No  . Sexual activity: Yes   Other Topics Concern  . Not on file   Social History Narrative  . No narrative on file    Allergies:  Allergies  Allergen Reactions  . Penicillins Hives    Medications: Prior to Admission medications   Medication Sig Start Date End Date Taking? Authorizing Provider  Calcium Carbonate-Vit D-Min (CALCIUM 1200 PO) Take 1 capsule by mouth daily.    Historical Provider, MD  Cholecalciferol (VITAMIN D3) 2000 UNITS CHEW Chew by mouth daily.    Historical Provider, MD  co-enzyme Q-10 30 MG capsule Take 30 mg by mouth 3 (three) times daily.    Historical Provider, MD  Estradiol-Norethindrone Acet (ACTIVELLA) 0.5-0.1 MG per tablet Take 1 tablet by mouth daily.    Historical Provider, MD  Multiple Vitamin (MULTIVITAMIN) tablet Take 1 tablet by mouth daily.    Historical Provider, MD  Omega-3 Fatty Acids (FISH OIL) 1000 MG CAPS Take 1 capsule by mouth daily.    Historical Provider, MD  Red Yeast Rice 600 MG CAPS Take 1 capsule by mouth daily.    Historical Provider, MD  valACYclovir (VALTREX) 500 MG tablet  11/19/15   Historical Provider, MD    Physical Exam Vitals:  Vitals:   05/31/16 1436  BP: 118/66  Pulse: 85   No LMP recorded. Patient is postmenopausal.  General: NAD HEENT: normocephalic, anicteric Pulmonary: No increased  work of breathing Cardiovascular: RRR, distal pulses 2+ Abdomen: NABS, soft, non-tender, non-distended.  Umbilicus without lesions.  No hepatomegaly, splenomegaly or masses palpable. No evidence of hernia  Genitourinary:  External: Normal external female genitalia.  Normal urethral meatus, normal Bartholin's and Skene's glands.    Vagina: Normal vaginal mucosa, splinting the posterior vaginal wall and having patient strain she does have a moderate cystocele, good posterior wall support, poor kegel  Cervix: Grossly normal in appearance, no bleeding  Uterus: Non-enlarged, mobile, normal contour.  No CMT  Adnexa: ovaries non-enlarged, no adnexal masses  Rectal: deferred  Lymphatic: no evidence of inguinal lymphadenopathy Extremities: no edema, erythema, or tenderness Neurologic: Grossly intact Psychiatric: mood appropriate, affect full  Female chaperone present for pelvic and breast  portions of the physical exam  Assessment: 61 y.o. G1P1001 with abdominal pressure and cystocele on exam  Plan: Problem List Items Addressed This Visit    None    Visit Diagnoses    Pelvic pressure in female    -  Primary   Relevant Orders   Hemoglobin A1c (Completed)   POCT urinalysis dipstick (Completed)   Urinary frequency       Relevant Orders   Hemoglobin A1c (Completed)   US Transvaginal Non-OB   POCT urinalysis dipstick (Completed)      Samples premarin moderate cystocele.  Consider pelvic floor PT poor kegel, TVUS to rule out other etiologies for patients pressure sensation.  At present patient is otherwise largely asymptomatc

## 2016-06-01 LAB — HEMOGLOBIN A1C
Est. average glucose Bld gHb Est-mCnc: 94 mg/dL
Hgb A1c MFr Bld: 4.9 % (ref 4.8–5.6)

## 2016-06-15 ENCOUNTER — Encounter: Payer: Self-pay | Admitting: Obstetrics and Gynecology

## 2016-06-15 ENCOUNTER — Ambulatory Visit (INDEPENDENT_AMBULATORY_CARE_PROVIDER_SITE_OTHER): Payer: BLUE CROSS/BLUE SHIELD | Admitting: Obstetrics and Gynecology

## 2016-06-15 ENCOUNTER — Ambulatory Visit (INDEPENDENT_AMBULATORY_CARE_PROVIDER_SITE_OTHER): Payer: BLUE CROSS/BLUE SHIELD

## 2016-06-15 VITALS — BP 112/66 | HR 69 | Wt 118.0 lb

## 2016-06-15 DIAGNOSIS — R350 Frequency of micturition: Secondary | ICD-10-CM

## 2016-06-15 DIAGNOSIS — R102 Pelvic and perineal pain: Secondary | ICD-10-CM

## 2016-06-15 DIAGNOSIS — N393 Stress incontinence (female) (male): Secondary | ICD-10-CM

## 2016-06-15 NOTE — Patient Instructions (Signed)
We discussed WHI study findings in detail.  In the combined estrogen-progesterone arm breast cancer risk was increased by 1.26 (CI of 1.00 to 1.59), coronary heart disease 1.29 (CI 1.02-1.63), stroke risk 1.41 (1.07-1.85), and pulmonary embolism 2.13 (CI 1.39-3.25).  That being said the while statistically significant the actual number of cases attributable are relatively small at an addition 8 cases of breast cancer, 7 more coronary artery event, 8 more strokes, and 8 additional case of pulmonary embolism per 10,000 women.  Study was terminated because of the increased breast cancer risk, this was not seen in the progestin only arm of the study for women without an intact uterus.  In addition it is important to note that HRT also had positive or risk reucing effects, and all cause mortality between the HRT/non-HRT users is not statistically different.  Estrogen-progestin HRT decreased the relative risk of hip fracture 0.66 (CI 0.45-0.98), colorectal cancer 0.63 (0.43-0.92).  Current consensus is to limit dose to the lowest effective dose, and shortest treatment duration possible.  Breast cancer risk appeared to increase after 4 years of use.  Also important to note is that these risk refer to systemic HRT for the treatment of vasomotor symptoms, and do not apply to vaginal preperations with minimal systemic absorption and aimed at treating symptoms of vulvovaginal atrophy.    We briefly touched on findings of KEEPS trial which did not show any improvement in cognitive function for women on HRT.  Conversely there was also no significant cognitive declines seen in women in the HRT group as had previously been theroized given the increased risk of CVA in the WHI study with HRT use and the possibility of micro-infarcts.  

## 2016-06-15 NOTE — Progress Notes (Signed)
Gynecology Ultrasound Follow Up  Chief Complaint:  Chief Complaint  Patient presents with  . U/S follow up     History of Present Illness: Patient is a 61 y.o. female who presents today for ultrasound evaluation of pelvic pressure  Ultrasound demonstrates the following findgins Adnexa: no masses seen Uterus: Non-enlarged with endometrial stripe 6mm and some calcifications (no postmenopausal bleeding concerns) Additional: no free fluid  Review of Systems: Review of Systems  Genitourinary: Negative for dysuria, flank pain, frequency, hematuria and urgency.    Past Medical History:  Past Medical History:  Diagnosis Date  . Arthritis    hips, legs  . Motion sickness    boats  . Osteoporosis   . Palpitations    with too much caffeine    Past Surgical History:  Past Surgical History:  Procedure Laterality Date  . BREAST SURGERY Left 06/30/2014   Vacuum assisted biopsy, fibrocystic changes.  . COLONOSCOPY    . COLONOSCOPY WITH PROPOFOL N/A 02/25/2016   Procedure: COLONOSCOPY WITH PROPOFOL;  Surgeon: Midge Minium, MD;  Location: [facility];  Service: Endoscopy;  Laterality: N/A;  . TONSILLECTOMY  1972  . TUBAL LIGATION  1987    Gynecologic History:  No LMP recorded. Patient is postmenopausal.  Family History:  Family History  Problem Relation Age of Onset  . Diabetes Mother     Type 2  . Hypertension Mother   . Heart disease Mother   . Lung cancer Brother   . Thyroid cancer Paternal Grandmother     Benign    Social History:  Social History   Social History  . Marital status: Married    Spouse name: N/A  . Number of children: N/A  . Years of education: N/A   Occupational History  . Not on file.   Social History Main Topics  . Smoking status: Never Smoker  . Smokeless tobacco: Never Used  . Alcohol use 1.8 oz/week    3 Cans of beer per week  . Drug use: No  . Sexual activity: Yes   Other Topics Concern  . Not on file   Social History  Narrative  . No narrative on file    Allergies:  Allergies  Allergen Reactions  . Penicillins Hives    Medications: Prior to Admission medications   Medication Sig Start Date End Date Taking? Authorizing Provider  Calcium Carbonate-Vit D-Min (CALCIUM 1200 PO) Take 1 capsule by mouth daily.    Historical Provider, MD  Cholecalciferol (VITAMIN D3) 2000 UNITS CHEW Chew by mouth daily.    Historical Provider, MD  co-enzyme Q-10 30 MG capsule Take 30 mg by mouth 3 (three) times daily.    Historical Provider, MD  Estradiol-Norethindrone Acet (ACTIVELLA) 0.5-0.1 MG per tablet Take 1 tablet by mouth daily.    Historical Provider, MD  Multiple Vitamin (MULTIVITAMIN) tablet Take 1 tablet by mouth daily.    Historical Provider, MD  Omega-3 Fatty Acids (FISH OIL) 1000 MG CAPS Take 1 capsule by mouth daily.    Historical Provider, MD  Red Yeast Rice 600 MG CAPS Take 1 capsule by mouth daily.    Historical Provider, MD  valACYclovir (VALTREX) 500 MG tablet  11/19/15   Historical Provider, MD    Physical Exam Vitals: Blood pressure 112/66, pulse 69, weight 118 lb (53.5 kg).  General: NAD HEENT: normocephalic, anicteric Pulmonary: No increased work of breathing Extremities: no edema, erythema, or tenderness Neurologic: Grossly intact, normal gait Psychiatric: mood appropriate, affect full  Assessment: 61 y.o. G1P1001 pelvic pressure and occasional SUI  Plan: Problem List Items Addressed This Visit    None    Visit Diagnoses    Pelvic pressure in female    -  Primary   Relevant Orders   Ambulatory referral to Physical Therapy   SUI (stress urinary incontinence, female)       Relevant Orders   Ambulatory referral to Physical Therapy      1) An endometrial measurement greater than 4 mm that is incidentally discovered in a postmenopausal patient without bleeding need not routinely trigger evaluation, although an individualized assessment based on patient characteristics and risk factors  is appropriate. Thus, transvaginal ultrasonography is not an appropriate screening tool for endometrial cancer in postmenopausal women without bleeding.    ACOG Committee Opinion 734, May 2018 "The Role of Transvaginal Ultrasonography in Evaluating the Endometrium of Women with Postmenopausal Bleeding"  2) Pelvic pressure and SUI - patient was given samples of premarin last visit has not started these.  Given below information with emphasis on vaginal estrogen not being the same as systemic estrogen.  Will arrange for PT referral.  We discussed pessaries   We discussed WHI study findings in detail.  In the combined estrogen-progesterone arm breast cancer risk was increased by 1.26 (CI of 1.00 to  1.59), coronary heart disease 1.29 (CI 1.02-1.63), stroke risk 1.41 (1.07-1.85), and pulmonary embolism 2.13 (CI 1.39-3.25).  That being said the  while statistically significant the actual number of cases attributable are relatively small at an addition 8 cases of breast cancer, 7 more coronary  artery event, 8 more strokes, and 8 additional case of pulmonary embolism per 10,000 women.  Study was terminated because of the increased  breast cancer risk, this was not seen in the progestin only arm of the study for women without an intact uterus.   In addition it is important to note that HRT also had positive or risk reucing effects, and all cause mortality between the HRT/non-HRT users is not s tatistically different.  Estrogen-progestin HRT decreased the relative risk of hip fracture 0.66 (CI 0.45-0.98), colorectal cancer 0.63 (0.43-0.92).   Current consensus is to limit dose to the lowest effective dose, and shortest treatment duration possible.  Breast cancer risk appeared to increase  after 4 years of use.  Also important to note is that these risk refer to systemic HRT for the treatment of vasomotor symptoms, and do not apply to  vaginal preperations with minimal systemic absorption and aimed at treating  symptoms of vulvovaginal atrophy.     We briefly touched on findings of KEEPS trial which did not show any improvement in cognitive function for women on HRT.  Conversely there was  also no significant cognitive declines seen in women in the HRT group as had previously been theroized given the increased risk of CVA in the WHI  study with HRT use and the possibility of micro-infarcts.  3) RTC 1 year annual

## 2016-08-22 ENCOUNTER — Ambulatory Visit: Payer: BLUE CROSS/BLUE SHIELD | Admitting: Physical Therapy

## 2016-08-30 ENCOUNTER — Encounter: Payer: BLUE CROSS/BLUE SHIELD | Admitting: Physical Therapy

## 2016-09-05 ENCOUNTER — Encounter: Payer: BLUE CROSS/BLUE SHIELD | Admitting: Physical Therapy

## 2016-09-06 ENCOUNTER — Encounter: Payer: BLUE CROSS/BLUE SHIELD | Admitting: Physical Therapy

## 2016-09-12 ENCOUNTER — Encounter: Payer: BLUE CROSS/BLUE SHIELD | Admitting: Physical Therapy

## 2016-09-27 ENCOUNTER — Encounter: Payer: BLUE CROSS/BLUE SHIELD | Admitting: Physical Therapy

## 2016-10-10 ENCOUNTER — Encounter: Payer: BLUE CROSS/BLUE SHIELD | Admitting: Physical Therapy

## 2016-10-24 ENCOUNTER — Encounter: Payer: BLUE CROSS/BLUE SHIELD | Admitting: Physical Therapy

## 2016-12-05 DIAGNOSIS — R002 Palpitations: Secondary | ICD-10-CM | POA: Diagnosis not present

## 2016-12-05 DIAGNOSIS — R0789 Other chest pain: Secondary | ICD-10-CM | POA: Diagnosis not present

## 2016-12-08 ENCOUNTER — Encounter: Payer: Self-pay | Admitting: Obstetrics and Gynecology

## 2016-12-08 ENCOUNTER — Ambulatory Visit (INDEPENDENT_AMBULATORY_CARE_PROVIDER_SITE_OTHER): Payer: BLUE CROSS/BLUE SHIELD | Admitting: Obstetrics and Gynecology

## 2016-12-08 VITALS — BP 104/80 | HR 77 | Ht 61.0 in | Wt 120.0 lb

## 2016-12-08 DIAGNOSIS — Z13220 Encounter for screening for lipoid disorders: Secondary | ICD-10-CM | POA: Diagnosis not present

## 2016-12-08 DIAGNOSIS — Z1239 Encounter for other screening for malignant neoplasm of breast: Secondary | ICD-10-CM

## 2016-12-08 DIAGNOSIS — Z124 Encounter for screening for malignant neoplasm of cervix: Secondary | ICD-10-CM | POA: Diagnosis not present

## 2016-12-08 DIAGNOSIS — Z01419 Encounter for gynecological examination (general) (routine) without abnormal findings: Secondary | ICD-10-CM

## 2016-12-08 DIAGNOSIS — Z1231 Encounter for screening mammogram for malignant neoplasm of breast: Secondary | ICD-10-CM

## 2016-12-08 DIAGNOSIS — Z131 Encounter for screening for diabetes mellitus: Secondary | ICD-10-CM

## 2016-12-08 DIAGNOSIS — Z1329 Encounter for screening for other suspected endocrine disorder: Secondary | ICD-10-CM

## 2016-12-08 DIAGNOSIS — Z1321 Encounter for screening for nutritional disorder: Secondary | ICD-10-CM | POA: Diagnosis not present

## 2016-12-08 MED ORDER — VALACYCLOVIR HCL 500 MG PO TABS: 500.0000 mg | ORAL_TABLET | Freq: Every day | ORAL | 11 refills | Status: DC

## 2016-12-08 NOTE — Patient Instructions (Signed)
Preventive Care 40-64 Years, Female Preventive care refers to lifestyle choices and visits with your health care provider that can promote health and wellness. What does preventive care include?  A yearly physical exam. This is also called an annual well check.  Dental exams once or twice a year.  Routine eye exams. Ask your health care provider how often you should have your eyes checked.  Personal lifestyle choices, including: ? Daily care of your teeth and gums. ? Regular physical activity. ? Eating a healthy diet. ? Avoiding tobacco and drug use. ? Limiting alcohol use. ? Practicing safe sex. ? Taking low-dose aspirin daily starting at age 58. ? Taking vitamin and mineral supplements as recommended by your health care provider. What happens during an annual well check? The services and screenings done by your health care provider during your annual well check will depend on your age, overall health, lifestyle risk factors, and family history of disease. Counseling Your health care provider may ask you questions about your:  Alcohol use.  Tobacco use.  Drug use.  Emotional well-being.  Home and relationship well-being.  Sexual activity.  Eating habits.  Work and work Statistician.  Method of birth control.  Menstrual cycle.  Pregnancy history.  Screening You may have the following tests or measurements:  Height, weight, and BMI.  Blood pressure.  Lipid and cholesterol levels. These may be checked every 5 years, or more frequently if you are over 81 years old.  Skin check.  Lung cancer screening. You may have this screening every year starting at age 78 if you have a 30-pack-year history of smoking and currently smoke or have quit within the past 15 years.  Fecal occult blood test (FOBT) of the stool. You may have this test every year starting at age 65.  Flexible sigmoidoscopy or colonoscopy. You may have a sigmoidoscopy every 5 years or a colonoscopy  every 10 years starting at age 30.  Hepatitis C blood test.  Hepatitis B blood test.  Sexually transmitted disease (STD) testing.  Diabetes screening. This is done by checking your blood sugar (glucose) after you have not eaten for a while (fasting). You may have this done every 1-3 years.  Mammogram. This may be done every 1-2 years. Talk to your health care provider about when you should start having regular mammograms. This may depend on whether you have a family history of breast cancer.  BRCA-related cancer screening. This may be done if you have a family history of breast, ovarian, tubal, or peritoneal cancers.  Pelvic exam and Pap test. This may be done every 3 years starting at age 80. Starting at age 36, this may be done every 5 years if you have a Pap test in combination with an HPV test.  Bone density scan. This is done to screen for osteoporosis. You may have this scan if you are at high risk for osteoporosis.  Discuss your test results, treatment options, and if necessary, the need for more tests with your health care provider. Vaccines Your health care provider may recommend certain vaccines, such as:  Influenza vaccine. This is recommended every year.  Tetanus, diphtheria, and acellular pertussis (Tdap, Td) vaccine. You may need a Td booster every 10 years.  Varicella vaccine. You may need this if you have not been vaccinated.  Zoster vaccine. You may need this after age 5.  Measles, mumps, and rubella (MMR) vaccine. You may need at least one dose of MMR if you were born in  1958 or later. You may also need a second dose.  Pneumococcal 13-valent conjugate (PCV13) vaccine. You may need this if you have certain conditions and were not previously vaccinated.  Pneumococcal polysaccharide (PPSV23) vaccine. You may need one or two doses if you smoke cigarettes or if you have certain conditions.  Meningococcal vaccine. You may need this if you have certain  conditions.  Hepatitis A vaccine. You may need this if you have certain conditions or if you travel or work in places where you may be exposed to hepatitis A.  Hepatitis B vaccine. You may need this if you have certain conditions or if you travel or work in places where you may be exposed to hepatitis B.  Haemophilus influenzae type b (Hib) vaccine. You may need this if you have certain conditions.  Talk to your health care provider about which screenings and vaccines you need and how often you need them. This information is not intended to replace advice given to you by your health care provider. Make sure you discuss any questions you have with your health care provider. Document Released: 02/26/2015 Document Revised: 10/20/2015 Document Reviewed: 12/01/2014 Elsevier Interactive Patient Education  2017 [employer].

## 2016-12-08 NOTE — Progress Notes (Signed)
Gynecology Annual Exam  PCP: Natasha Andrews, No Pcp Per  Chief Complaint:  Chief Complaint  Natasha Andrews presents with  . Gynecologic Exam    Requesting blood work    History of Present Illness:Natasha Andrews is a 61 y.o. G1P1001 presents for annual exam. The Natasha Andrews has no complaints today.   LMP: No LMP recorded. Natasha Andrews is postmenopausal. No postmenopausal bleeding.  The Natasha Andrews is sexually active. She denies dyspareunia.  The Natasha Andrews does perform self breast exams.  There is no notable family history of breast or ovarian cancer in her family.  The Natasha Andrews wears seatbelts: yes.   The Natasha Andrews has regular exercise: not asked.    The Natasha Andrews denies current symptoms of depression.     Review of Systems: Review of Systems  Constitutional: Negative for chills and fever.  HENT: Negative for congestion.   Respiratory: Negative for cough and shortness of breath.   Cardiovascular: Negative for chest pain and palpitations.  Gastrointestinal: Negative for abdominal pain, constipation, diarrhea, heartburn, nausea and vomiting.  Genitourinary: Negative for dysuria, frequency and urgency.  Skin: Negative for itching and rash.  Neurological: Negative for dizziness and headaches.  Endo/Heme/Allergies: Negative for polydipsia.  Psychiatric/Behavioral: Negative for depression.    Past Medical History:  Past Medical History:  Diagnosis Date  . Arthritis    hips, legs  . Motion sickness    boats  . Osteoporosis   . Palpitations    with too much caffeine    Past Surgical History:  Past Surgical History:  Procedure Laterality Date  . BREAST SURGERY Left 06/30/2014   Vacuum assisted biopsy, fibrocystic changes.  . COLONOSCOPY    . COLONOSCOPY WITH PROPOFOL N/A 02/25/2016   Procedure: COLONOSCOPY WITH PROPOFOL;  Surgeon: Midge Miniumarren Wohl, MD;  [facility];  Service: Endoscopy;  Laterality: N/A;  . TONSILLECTOMY  1972  . TUBAL LIGATION  1987    Gynecologic History:  No LMP  recorded. Natasha Andrews is postmenopausal. Last Pap: Results were: 12/08/15 NIL and HR HPV negative  Last mammogram: 11/27/2014 Results were: BI-RAD I   Obstetric History: G1P1001  Family History:  Family History  Problem Relation Age of Onset  . Diabetes Mother        Type 2  . Hypertension Mother   . Heart disease Mother   . Lung cancer Brother   . Thyroid cancer Paternal Grandmother        Benign    Social History:  Social History   Social History  . Marital status: Married    Spouse name: N/A  . Number of children: N/A  . Years of education: N/A   Occupational History  . Not on file.   Social History Main Topics  . Smoking status: Never Smoker  . Smokeless tobacco: Never Used  . Alcohol use 1.8 oz/week    3 Cans of beer per week  . Drug use: No  . Sexual activity: Yes    Birth control/ protection: Post-menopausal   Other Topics Concern  . Not on file   Social History Narrative  . No narrative on file    Allergies:  Allergies  Allergen Reactions  . Penicillins Hives    Medications: Prior to Admission medications   Medication Sig Start Date End Date Taking? Authorizing Provider  Calcium Carbonate-Vit D-Min (CALCIUM 1200 PO) Take 1 capsule by mouth daily.   Yes [provider]  Cholecalciferol (VITAMIN D3) 2000 UNITS CHEW Chew by mouth daily.   Yes [provider]  co-enzyme  Q-10 30 MG capsule Take 30 mg by mouth 3 (three) times daily.   Yes [provider]  Estradiol-Norethindrone Acet (ACTIVELLA) 0.5-0.1 MG per tablet Take 1 tablet by mouth daily.   Yes [provider]  Omega-3 Fatty Acids (FISH OIL) 1000 MG CAPS Take 1 capsule by mouth daily.   Yes [provider]  Red Yeast Rice 600 MG CAPS Take 1 capsule by mouth daily.   Yes [provider]  valACYclovir (VALTREX) 500 MG tablet  11/19/15  Yes [provider]    Physical Exam Vitals: Blood pressure 104/80, pulse 77, height 5\' 1"  (1.549 m),  weight 120 lb (54.4 kg).  General: NAD HEENT: normocephalic, anicteric Thyroid: no enlargement, no palpable nodules Pulmonary: No increased work of breathing, CTAB Cardiovascular: RRR, distal pulses 2+ Breast: Breast symmetrical, no tenderness, no palpable nodules or masses, no skin or nipple retraction present, no nipple discharge.  No axillary or supraclavicular lymphadenopathy. Abdomen: NABS, soft, non-tender, non-distended.  Umbilicus without lesions.  No hepatomegaly, splenomegaly or masses palpable. No evidence of hernia  Genitourinary:  External: Normal external female genitalia.  Normal urethral meatus, normal  Bartholin's and Skene's glands.    Vagina: Normal vaginal mucosa, no evidence of prolapse.    Cervix: Grossly normal in appearance, no bleeding  Uterus: Non-enlarged, mobile, normal contour.  No CMT  Adnexa: ovaries non-enlarged, no adnexal masses  Rectal: deferred  Lymphatic: no evidence of inguinal lymphadenopathy Extremities: no edema, erythema, or tenderness Neurologic: Grossly intact Psychiatric: mood appropriate, affect full  Female chaperone present for pelvic and breast  portions of the physical exam     Assessment: 61 y.o. G1P1001 routine annual exam  Plan: Problem List Items Addressed This Visit    None    Visit Diagnoses    Cervical cancer screening    -  Primary   Relevant Orders   PapIG, HPV, rfx 16/18   Screening for diabetes mellitus       Relevant Orders   CMP14+LP+TP+TSH+CBC/Plt   Screening for lipoid disorders       Relevant Orders   CMP14+LP+TP+TSH+CBC/Plt   Breast screening       Relevant Orders   MM DIGITAL SCREENING BILATERAL   Encounter for gynecological examination without abnormal finding       Relevant Orders   CMP14+LP+TP+TSH+CBC/Plt   Thyroid disorder screen       Relevant Orders   CMP14+LP+TP+TSH+CBC/Plt   Encounter for vitamin deficiency screening       Relevant Orders   Vitamin D (25 hydroxy)      1) Mammogram -  recommend yearly screening mammogram.  Mammogram Was ordered today  2) STI screening was not offered  3) ASCCP guidelines and rational discussed.  Natasha Andrews opts for yearly screening interval  4) Osteoporosis  - per USPTF routine screening DEXA at age 12 - Vitamin D level today  5) Routine healthcare maintenance including cholesterol, diabetes screening discussed Ordered today  6) Colonoscopy - up to date 02/25/2016 Dr. Servando Snare  7) Follow up 1 year for routine annual

## 2016-12-09 LAB — CMP14+LP+TP+TSH+CBC/PLT
ALT: 12 IU/L (ref 0–32)
AST: 15 IU/L (ref 0–40)
Albumin/Globulin Ratio: 2.2 (ref 1.2–2.2)
Albumin: 4.6 g/dL (ref 3.6–4.8)
Alkaline Phosphatase: 42 IU/L (ref 39–117)
BUN/Creatinine Ratio: 12 (ref 12–28)
BUN: 11 mg/dL (ref 8–27)
Bilirubin Total: 0.5 mg/dL (ref 0.0–1.2)
CO2: 23 mmol/L (ref 20–29)
Calcium: 9.7 mg/dL (ref 8.7–10.3)
Chloride: 103 mmol/L (ref 96–106)
Cholesterol, Total: 175 mg/dL (ref 100–199)
Creatinine, Ser: 0.89 mg/dL (ref 0.57–1.00)
Free Thyroxine Index: 1.5 (ref 1.2–4.9)
GFR calc Af Amer: 80 mL/min/{1.73_m2} (ref >59)
GFR calc non Af Amer: 70 mL/min/{1.73_m2} (ref >59)
Globulin, Total: 2.1 g/dL (ref 1.5–4.5)
Glucose: 92 mg/dL (ref 65–99)
HDL: 99 mg/dL (ref >39)
Hematocrit: 42.9 % (ref 34.0–46.6)
Hemoglobin: 14.5 g/dL (ref 11.1–15.9)
LDL Calculated: 64 mg/dL (ref 0–99)
LDl/HDL Ratio: 0.6 ratio (ref 0.0–3.2)
MCH: 31.2 pg (ref 26.6–33.0)
MCHC: 33.8 g/dL (ref 31.5–35.7)
MCV: 92 fL (ref 79–97)
Platelets: 228 10*3/uL (ref 150–379)
Potassium: 4.2 mmol/L (ref 3.5–5.2)
RBC: 4.65 x10E6/uL (ref 3.77–5.28)
RDW: 13.3 % (ref 12.3–15.4)
Sodium: 140 mmol/L (ref 134–144)
T3 Uptake Ratio: 23 % — ABNORMAL LOW (ref 24–39)
T4, Total: 6.5 ug/dL (ref 4.5–12.0)
TSH: 1.62 u[IU]/mL (ref 0.450–4.500)
Total Protein: 6.7 g/dL (ref 6.0–8.5)
Triglycerides: 60 mg/dL (ref 0–149)
VLDL Cholesterol Cal: 12 mg/dL (ref 5–40)
WBC: 6.5 10*3/uL (ref 3.4–10.8)

## 2016-12-09 LAB — VITAMIN D 25 HYDROXY (VIT D DEFICIENCY, FRACTURES): Vit D, 25-Hydroxy: 54.5 ng/mL (ref 30.0–100.0)

## 2016-12-12 LAB — PAPIG, HPV, RFX 16/18
HPV, high-risk: NEGATIVE
PAP Smear Comment: 0

## 2016-12-15 ENCOUNTER — Encounter: Payer: Self-pay | Admitting: Obstetrics and Gynecology

## 2016-12-19 DIAGNOSIS — R0789 Other chest pain: Secondary | ICD-10-CM | POA: Diagnosis not present

## 2017-01-02 DIAGNOSIS — F4321 Adjustment disorder with depressed mood: Secondary | ICD-10-CM | POA: Diagnosis not present

## 2017-01-02 DIAGNOSIS — R0789 Other chest pain: Secondary | ICD-10-CM | POA: Diagnosis not present

## 2017-01-02 DIAGNOSIS — R002 Palpitations: Secondary | ICD-10-CM | POA: Diagnosis not present

## 2017-01-16 ENCOUNTER — Ambulatory Visit: Admission: RE | Admit: 2017-01-16 | Payer: Self-pay | Source: Ambulatory Visit

## 2017-01-18 ENCOUNTER — Ambulatory Visit
Admission: RE | Admit: 2017-01-18 | Discharge: 2017-01-18 | Disposition: A | Discharge status: Home / Self Care | Payer: BLUE CROSS/BLUE SHIELD | Source: Ambulatory Visit | Attending: Obstetrics and Gynecology | Admitting: Obstetrics and Gynecology

## 2017-01-18 DIAGNOSIS — Z1239 Encounter for other screening for malignant neoplasm of breast: Secondary | ICD-10-CM

## 2017-01-18 DIAGNOSIS — Z1231 Encounter for screening mammogram for malignant neoplasm of breast: Secondary | ICD-10-CM | POA: Insufficient documentation

## 2017-01-18 DIAGNOSIS — R928 Other abnormal and inconclusive findings on diagnostic imaging of breast: Secondary | ICD-10-CM | POA: Diagnosis not present

## 2017-01-19 ENCOUNTER — Other Ambulatory Visit: Payer: Self-pay | Admitting: Obstetrics and Gynecology

## 2017-01-19 DIAGNOSIS — N6310 Unspecified lump in the right breast, unspecified quadrant: Secondary | ICD-10-CM

## 2017-01-19 DIAGNOSIS — R928 Other abnormal and inconclusive findings on diagnostic imaging of breast: Secondary | ICD-10-CM

## 2017-02-01 ENCOUNTER — Other Ambulatory Visit: Payer: Self-pay | Admitting: Obstetrics and Gynecology

## 2017-02-01 ENCOUNTER — Ambulatory Visit
Admission: RE | Admit: 2017-02-01 | Discharge: 2017-02-01 | Disposition: A | Discharge status: Home / Self Care | Payer: BLUE CROSS/BLUE SHIELD | Source: Ambulatory Visit | Attending: Obstetrics and Gynecology | Admitting: Obstetrics and Gynecology

## 2017-02-01 DIAGNOSIS — R928 Other abnormal and inconclusive findings on diagnostic imaging of breast: Secondary | ICD-10-CM

## 2017-02-01 DIAGNOSIS — N6001 Solitary cyst of right breast: Secondary | ICD-10-CM | POA: Diagnosis not present

## 2017-02-01 DIAGNOSIS — N6310 Unspecified lump in the right breast, unspecified quadrant: Secondary | ICD-10-CM

## 2017-02-01 DIAGNOSIS — N6011 Diffuse cystic mastopathy of right breast: Secondary | ICD-10-CM | POA: Insufficient documentation

## 2017-02-01 DIAGNOSIS — R922 Inconclusive mammogram: Secondary | ICD-10-CM | POA: Diagnosis not present

## 2017-02-11 NOTE — Progress Notes (Signed)
Obstetrics & Gynecology Office Visit   Chief Complaint:  Chief Complaint  Patient presents with  . Committee Review    Menopausal S&S/Anxiety  . Mammogram    discuss abnormal results    History of Present Illness: The patient is a 61 y.o. female presenting initial evaluation for symptoms of anxiety.  The patient is currently taking Zoloft 25mg  for the management of her symptoms.  She has had any recent situational stressors, mostly related around stress at work.  She reports symptoms of insomnia, irritability and social anxiety.  She denies anhedonia, day time somnolence, risk taking behavior, increased appetite, decreased appetite, agorophobia, feelings of guilt, feelings of worthlessness, suicidal ideation, homicidal ideation, auditory hallucinations and visual hallucinations. Symptoms have remained unchanged since intial onset.     The patient does have a pre-existing history of depression and anxiety.  She  does not a prior history of suicide attempts.  Previous treatment tied include Zoloft and Benzodiazapines.  Has not had any side-effects on Zoloft but no significant improvement either.  She is having some mild vasomotor symptoms but these are not really causing her much distress.  She is not having significant sleep disturbances secondary to her vasomotor symptoms which may explain some of her anxiety/mood symptoms.  The patient had follow up ultrasound and BI-RAD III read on her most recent mammogram with recommendation for follow up imaging in 6 months.  She is a little leary about waiting that amount of time before having re-imaging of the right breast and would like to discuss with Dr. Lemar LivingsByrnett if possible.     Review of Systems: Review of Systems  Constitutional: Negative for chills and fever.  Gastrointestinal: Negative for abdominal pain, constipation and diarrhea.  Skin: Negative for rash.  Neurological: Negative for dizziness and headaches.  Psychiatric/Behavioral:  Negative for depression, hallucinations, memory loss, substance abuse and suicidal ideas. The patient is nervous/anxious and has insomnia.    Past Medical History:  Past Medical History:  Diagnosis Date  . Arthritis    hips, legs  . Motion sickness    boats  . Osteoporosis   . Palpitations    with too much caffeine    Past Surgical History:  Past Surgical History:  Procedure Laterality Date  . BREAST BIOPSY Left 06/30/2014   u/s bx/clip- neg  . BREAST SURGERY Left 06/30/2014   Vacuum assisted biopsy, fibrocystic changes.  . COLONOSCOPY    . COLONOSCOPY WITH PROPOFOL N/A 02/25/2016   Procedure: COLONOSCOPY WITH PROPOFOL;  Surgeon: Midge Miniumarren Wohl, MD;  [facility];  Service: Endoscopy;  Laterality: N/A;  . TONSILLECTOMY  1972  . TUBAL LIGATION  1987    Gynecologic History: No LMP recorded. Patient is postmenopausal.  Obstetric History: G1P1001  Family History:  Family History  Problem Relation Age of Onset  . Diabetes Mother        Type 2  . Hypertension Mother   . Heart disease Mother   . Lung cancer Brother   . Thyroid cancer Paternal Grandmother        Benign  . Breast cancer Neg Hx     Social History:  Social History   Socioeconomic History  . Marital status: Married    Spouse name: Not on file  . Number of children: Not on file  . Years of education: Not on file  . Highest education level: Not on file  Social Needs  . Financial resource strain: Not on file  . Food insecurity -  worry: Not on file  . Food insecurity - inability: Not on file  . Transportation needs - medical: Not on file  . Transportation needs - non-medical: Not on file  Occupational History  . Not on file  Tobacco Use  . Smoking status: Never Smoker  . Smokeless tobacco: Never Used  Substance and Sexual Activity  . Alcohol use: Yes    Alcohol/week: 1.8 oz    Types: 3 Cans of beer per week  . Drug use: No  . Sexual activity: Yes    Birth control/protection:  Post-menopausal  Other Topics Concern  . Not on file  Social History Narrative  . Not on file    Allergies:  Allergies  Allergen Reactions  . Penicillins Hives    Medications: Prior to Admission medications   Medication Sig Start Date End Date Taking? Authorizing Provider  Calcium Carbonate-Vit D-Min (CALCIUM 1200 PO) Take 1 capsule by mouth daily.    [provider]  Cholecalciferol (VITAMIN D3) 2000 UNITS CHEW Chew by mouth daily.    [provider]  co-enzyme Q-10 30 MG capsule Take 30 mg by mouth 3 (three) times daily.    [provider]  Estradiol-Norethindrone Acet (ACTIVELLA) 0.5-0.1 MG per tablet Take 1 tablet by mouth daily.    [provider]  Omega-3 Fatty Acids (FISH OIL) 1000 MG CAPS Take 1 capsule by mouth daily.    [provider]  Red Yeast Rice 600 MG CAPS Take 1 capsule by mouth daily.    [provider]  valACYclovir (VALTREX) 500 MG tablet Take 1 tablet (500 mg total) by mouth daily. 12/08/16   Vena Austria, MD    Physical Exam Vitals:  Vitals:   02/12/17 0852  BP: 118/62  Pulse: 88   No LMP recorded. Patient is postmenopausal.  General: NAD HEENT: normocephalic, anicteric Pulmonary: No increased work of breathing Extremities: no edema, erythema, or tenderness Neurologic: Grossly intact Psychiatric: mood appropriate, affect full  Assessment: 61 y.o. G1P1001 presenting for evaluation of generalized anxiety disorder and to discuss recent mammogram result  Plan: Problem List Items Addressed This Visit    None    Visit Diagnoses    Generalized anxiety disorder    -  Primary   Relevant Medications   sertraline (ZOLOFT) 25 MG tablet   escitalopram (LEXAPRO) 10 MG tablet   hydrOXYzine (ATARAX/VISTARIL) 25 MG tablet   BI-RADS category 3 mammogram result       Relevant Orders   Ambulatory referral to General Surgery   Vasomotor symptoms due to menopause         1) HRT counseling We  discussed WHI study findings in detail.  In the combined estrogen-progesterone arm breast cancer risk was increased by 1.26 (CI of 1.00 to 1.59), coronary heart disease 1.29 (CI 1.02-1.63), stroke risk 1.41 (1.07-1.85), and pulmonary embolism 2.13 (CI 1.39-3.25).  That being said the while statistically significant the actual number of cases attributable are relatively small at an addition 8 cases of breast cancer, 7 more coronary artery event, 8 more strokes, and 8 additional case of pulmonary embolism per 10,000 women.  Study was terminated because of the increased breast cancer risk, this was not seen in the progestin only arm of the study for women without an intact uterus.  In addition it is important to note that HRT also had positive or risk reducing effects, and all cause mortality between the HRT/non-HRT users is not statistically different.  Estrogen-progestin HRT decreased the relative risk of  hip fracture 0.66 (CI 0.45-0.98), colorectal cancer 0.63 (0.43-0.92).  Current consensus is to limit dose to the lowest effective dose, and shortest treatment duration possible.  Breast cancer risk appeared to increase after 4 years of use.  Also important to note is that these risk refer to systemic HRT for the treatment of vasomotor symptoms, and do not apply to vaginal preperations with minimal systemic absorption and aimed at treating symptoms of vulvovaginal atrophy.    We briefly touched on findings of WHIMS trial published in 2005 which looked at women 62 year of age or older, and whether HRT was protective against the development of dementia.  The study revealed that HRT actually increased the risk for the development of dementia but was limited by looking only at patients 965 years of age and older.  The subsequent KEEPS trial  In 2012 which looked at HRT in recently postmenopausal women did not show any improvement in cognitive function for women on HRT.  However, there was also no significant cognitive  declines seen in recently postmenopausal women receiving HRT as had previously been shown in the WHIMS trial.  - no clear data to support HRT for her current mood symptoms   2) Generalized Anxiety - start lexparo 10mg  and vistaril PHQ9 is 10 item 9 is 0 GAD-7 is 12 - discussed expected side-effect profile of medications.  I favor lexapro given the narrow theraputic index and FDA labeling for generalized anxiety - we did discuss possibility of adding buspirone if fails to achieve adequate results - to contact office if acute worsening in symptoms - TSH and vitamin D screening negative in October 2018 - asses treatment response in 4-6 weeks  3) BI-RAD III mammogram - will referr to Dr. Lemar LivingsByrnett for second opinion   A total of 25 minutes were spent in face-to-face contact with the patient during this encounter with over half of that time devoted to counseling and coordination of care.

## 2017-02-12 ENCOUNTER — Encounter: Payer: Self-pay | Admitting: Obstetrics and Gynecology

## 2017-02-12 ENCOUNTER — Ambulatory Visit: Payer: BLUE CROSS/BLUE SHIELD | Admitting: Obstetrics and Gynecology

## 2017-02-12 VITALS — BP 118/62 | HR 88 | Wt 120.0 lb

## 2017-02-12 DIAGNOSIS — N951 Menopausal and female climacteric states: Secondary | ICD-10-CM | POA: Diagnosis not present

## 2017-02-12 DIAGNOSIS — F411 Generalized anxiety disorder: Secondary | ICD-10-CM

## 2017-02-12 DIAGNOSIS — R928 Other abnormal and inconclusive findings on diagnostic imaging of breast: Secondary | ICD-10-CM | POA: Diagnosis not present

## 2017-02-12 MED ORDER — ESCITALOPRAM OXALATE 10 MG PO TABS: 10.0000 mg | ORAL_TABLET | Freq: Every day | ORAL | 3 refills | Status: DC

## 2017-02-12 MED ORDER — HYDROXYZINE HCL 25 MG PO TABS: 25.0000 mg | ORAL_TABLET | Freq: Four times a day (QID) | ORAL | 2 refills | Status: DC | PRN

## 2017-02-12 NOTE — Patient Instructions (Signed)
We discussed WHI study findings in detail.  In the combined estrogen-progesterone arm breast cancer risk was increased by 1.26 (CI of 1.00 to 1.59), coronary heart disease 1.29 (CI 1.02-1.63), stroke risk 1.41 (1.07-1.85), and pulmonary embolism 2.13 (CI 1.39-3.25).  That being said, while statistically significant the actual number of cases attributable are relatively small at an addition 8 cases of breast cancer, 7 more coronary artery event, 8 more strokes, and 8 additional case of pulmonary embolism per 10,000 women.  Study was terminated because of the increased breast cancer risk, this was not seen in the progestin only arm of the study for women without an intact uterus.  In addition it is important to note that HRT also had positive or risk reducing effects, and all cause mortality between the HRT/non-HRT users is not statistically different.  Estrogen-progestin HRT decreased the relative risk of hip fracture 0.66 (CI 0.45-0.98), colorectal cancer 0.63 (0.43-0.92).  Current consensus is to limit dose to the lowest effective dose, and shortest treatment duration possible.  Breast cancer risk appeared to increase after 4 years of use.  Also important to note is that these risk refer to systemic HRT for the treatment of vasomotor symptoms, and do not apply to vaginal preperations with minimal systemic absorption and aimed at treating symptoms of vulvovaginal atrophy.    We briefly touched on findings of WHIMS trial published in 2005 which looked at women 62 year of age or older, and whether HRT was protective against the development of dementia.  The study revealed that HRT actually increased the risk for the development of dementia but was limited by looking only at patients 62 years of age and older.  The subsequent KEEPS trial  In 2012 which looked at HRT in recently postmenopausal women did not show any improvement in cognitive function for women on HRT.  However, there was also no significant cognitive  declines seen in recently postmenopausal women receiving HRT as had previously been shown in the [facility].

## 2017-02-22 ENCOUNTER — Encounter: Payer: Self-pay | Admitting: General Surgery

## 2017-02-22 ENCOUNTER — Inpatient Hospital Stay: Payer: Self-pay

## 2017-02-22 ENCOUNTER — Ambulatory Visit: Payer: BLUE CROSS/BLUE SHIELD | Admitting: General Surgery

## 2017-02-22 VITALS — BP 128/80 | HR 89 | Resp 14 | Ht 61.0 in | Wt 120.0 lb

## 2017-02-22 DIAGNOSIS — N6001 Solitary cyst of right breast: Secondary | ICD-10-CM

## 2017-02-22 NOTE — Patient Instructions (Addendum)
Follow up in 5 months here [facility].   Continue self breast exams. Call office for any new breast issues or concerns.

## 2017-02-22 NOTE — Progress Notes (Signed)
Patient ID: Natasha Andrews, female   DOB: 08-04-1955, 62 y.o.   MRN: [id number]  Chief Complaint  Patient presents with  . Other    mammogram    HPI Natasha Andrews is a 62 y.o. female who presents for a breast evaluation. The most recent mammogram was done on 01/18/17, with added views of the right breast on 02/01/17.  Patient does perform regular self breast checks and gets regular mammograms done.  She reports no pain or discomfort with the breasts, no nipple discharge, and no injuries.   She stopped her hormone therapy 3 months ago.  During this time after cessation of hormone therapy she has had increased sleeplessness, crying spells and more difficulty with focus at work.  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      HPI  Past Medical History:  Diagnosis Date  . Arthritis    hips, legs  . Motion sickness    boats  . Osteoporosis   . Palpitations    with too much caffeine    Past Surgical History:  Procedure Laterality Date  . BREAST BIOPSY Left 06/30/2014   u/s bx/clip- neg  . BREAST SURGERY Left 06/30/2014   Vacuum assisted biopsy, fibrocystic changes.  . COLONOSCOPY    . COLONOSCOPY WITH PROPOFOL N/A 02/25/2016   Procedure: COLONOSCOPY WITH PROPOFOL;  Surgeon: Midge Minium, MD;  Location: [facility];  Service: Endoscopy;  Laterality: N/A;  . TONSILLECTOMY  1972  . TUBAL LIGATION  1987    Family History  Problem Relation Age of Onset  . Diabetes Mother        Type 2  . Hypertension Mother   . Heart disease Mother   . Lung cancer Brother   . Thyroid cancer Paternal Grandmother        Benign  . Breast cancer Neg Hx     Social History Social History   Tobacco Use  . Smoking status: Never Smoker  . Smokeless tobacco: Never Used  Substance Use Topics  . Alcohol use: Yes    Alcohol/week: 1.8 oz    Types: 3 Cans of beer  per week  . Drug use: No    Allergies  Allergen Reactions  . Penicillins Hives    Current Outpatient Medications  Medication Sig Dispense Refill  . Calcium Carbonate-Vit D-Min (CALCIUM 1200 PO) Take 1 capsule by mouth daily.    . Cholecalciferol (VITAMIN D3) 2000 UNITS CHEW Chew by mouth daily.    Marland Kitchen co-enzyme Q-10 30 MG capsule Take 60 mg by mouth daily.     Marland Kitchen escitalopram (LEXAPRO) 10 MG tablet Take 1 tablet (10 mg total) by mouth daily. 30 tablet 3  . hydrOXYzine (ATARAX/VISTARIL) 25 MG tablet Take 1 tablet (25 mg total) by mouth every 6 (six) hours as needed for anxiety. 40 tablet 2  . Omega-3 Fatty Acids (FISH OIL) 1000 MG CAPS Take 1 capsule by mouth daily.    . Red Yeast Rice 600 MG CAPS Take 1 capsule by mouth daily.    . valACYclovir (VALTREX) 500 MG tablet Take 1 tablet (500 mg total) by mouth daily. 30 tablet 11   No current facility-administered medications for this visit.     Review of Systems Review of Systems  Constitutional: Negative.   Respiratory: Negative.   Cardiovascular: Negative.  Blood pressure 128/80, pulse 89, resp. rate 14, height 5\' 1"  (1.549 m), weight 120 lb (54.4 kg).  Physical Exam Physical Exam  Constitutional: She is oriented to person, place, and time. She appears well-developed and well-nourished.  Eyes: Conjunctivae are normal. No scleral icterus.  Neck: Neck supple.  Cardiovascular: Normal rate, regular rhythm and normal heart sounds.  Pulmonary/Chest: Effort normal and breath sounds normal. Right breast exhibits no inverted nipple, no mass, no nipple discharge, no skin change and no tenderness. Left breast exhibits no inverted nipple, no mass, no nipple discharge, no skin change and no tenderness.  Lymphadenopathy:    She has no cervical adenopathy.    She has no axillary adenopathy.  Neurological: She is alert and oriented to person, place, and time.  Skin: Skin is warm and dry.  Psychiatric: She has a normal mood and affect.     Data Reviewed Screening mammograms of January 18, 2017 diagnostic studies and ultrasound of February 01, 2017 reviewed.  BI-RADS-3.  Some of the areas on the February 01, 2017 ultrasound were suggestive of intralesional lesions and it was elected to repeat the exam to determine if FNA aspiration be appropriate.  Examination of the right breast at the 12 o'clock position, 1 cm from the nipple showed a softly lobulated 0.56 x 0.59 x 0.87 cm simple cyst with 1 cyst wall inclusion strong posterior acoustic enhancement and sharp edge affect.  This can be observed.  At the 1 o'clock position 2 cm from the nipple a simple cyst measuring up to 0.45 cm is noted.  At the 2 o'clock position 2 cm from nipple an additional simple cyst measuring up to 0.33 cm is noted.  At the 2 o'clock position, 2 cm from nipple multiple small hypoechoic/anechoic lesions are identified.  At the 9 o'clock position a 0.23 cm simple cyst is noted.  BI-RADS-3.  Assessment    Multiple cystic areas in the breast likely related to recent cessation of hormonal therapy.    Plan    Observation is certainly warranted based on today's exam and the clinical history of changing hormonal therapy.  If the patient's vasomotor symptoms not resolve with her new job, it would be appropriate to reconsider initiation of hormonal replacement therapy as she is very symptomatic.  Would hold off repeat mammographic imaging until a final decision is made regarding the reinstitution of hormonal therapy.    Will follow up here in office in 5 months prior to any imaging done at [facility].  Recommended to hold off on imaging if she restarts her hormone therapy.   HPI, Physical Exam, Assessment and Plan have been scribed under the direction and in the presence of Earline MayotteJeffrey W. Bodie Abernethy, MD  Carron Brazenaryl-Lyn Kennedy, LPN  I have completed the exam and reviewed the above documentation for accuracy and completeness.  I agree with the above.  [employer]useum[employer]gallery [employer]  has been used and any errors in dictation or transcription are unintentional.  Donnalee CurryJeffrey Jaspal Pultz, M.D., F.A.C.S.  Merrily PewJeffrey W Akisha Sturgill 02/23/2017, 7:18 PM

## 2017-05-25 ENCOUNTER — Encounter: Payer: Self-pay | Admitting: *Deleted

## 2017-07-02 DIAGNOSIS — R002 Palpitations: Secondary | ICD-10-CM | POA: Diagnosis not present

## 2017-07-02 DIAGNOSIS — F4321 Adjustment disorder with depressed mood: Secondary | ICD-10-CM | POA: Diagnosis not present

## 2017-07-02 DIAGNOSIS — R0789 Other chest pain: Secondary | ICD-10-CM | POA: Diagnosis not present

## 2017-07-24 ENCOUNTER — Encounter: Payer: Self-pay | Admitting: General Surgery

## 2017-07-24 ENCOUNTER — Ambulatory Visit (INDEPENDENT_AMBULATORY_CARE_PROVIDER_SITE_OTHER): Payer: BLUE CROSS/BLUE SHIELD | Admitting: General Surgery

## 2017-07-24 ENCOUNTER — Ambulatory Visit: Payer: Self-pay

## 2017-07-24 VITALS — BP 118/68 | HR 65 | Resp 12 | Ht 61.0 in | Wt 125.0 lb

## 2017-07-24 DIAGNOSIS — N6001 Solitary cyst of right breast: Secondary | ICD-10-CM

## 2017-07-24 NOTE — Patient Instructions (Addendum)
The patient is aware to call back for any questions or concerns. Follow up in December 2019 with bilateral screening mammograms.

## 2017-07-24 NOTE — Progress Notes (Signed)
Patient ID: Natasha KocherNancy L Andrews, female   DOB: 12/25/1955, 62 y.o.   MRN: [id number]  Chief Complaint  Patient presents with  . Follow-up    HPI Natasha Kocherancy L Hakim is a 62 y.o. female here today for her five month follow up right breast ultrasound. No new breast issues. She has been off her hormonal therapy. She does admit to night sweats. 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  HPI  Past Medical History:  Diagnosis Date  . Arthritis    hips, legs  . Motion sickness    boats  . Osteoporosis   . Palpitations    with too much caffeine    Past Surgical History:  Procedure Laterality Date  . BREAST BIOPSY Left 06/30/2014   u/s bx/clip- neg  . BREAST SURGERY Left 06/30/2014   Vacuum assisted biopsy, fibrocystic changes.  . COLONOSCOPY    . COLONOSCOPY WITH PROPOFOL N/A 02/25/2016   Procedure: COLONOSCOPY WITH PROPOFOL;  Surgeon: Midge Miniumarren Wohl, MD;  [facility];  Service: Endoscopy;  Laterality: N/A;  . TONSILLECTOMY  1972  . TUBAL LIGATION  1987    Family History  Problem Relation Age of Onset  . Diabetes Mother        Type 2  . Hypertension Mother   . Heart disease Mother   . Lung cancer Brother   . Thyroid cancer Paternal Grandmother        Benign  . Breast cancer Neg Hx     Social History Social History   Tobacco Use  . Smoking status: Never Smoker  . Smokeless tobacco: Never Used  Substance Use Topics  . Alcohol use: Yes    Alcohol/week: 1.8 oz    Types: 3 Cans of beer per week  . Drug use: No    Allergies  Allergen Reactions  . Penicillins Hives    Current Outpatient Medications  Medication Sig Dispense Refill  . Calcium Carbonate-Vit D-Min (CALCIUM 1200 PO) Take 1 capsule by mouth daily.    . Cholecalciferol (VITAMIN D3) 2000 UNITS CHEW Chew by mouth daily.    Marland Kitchen. co-enzyme Q-10 30 MG capsule Take 60 mg by mouth daily.     . hydrOXYzine (ATARAX/VISTARIL) 25 MG tablet Take 1 tablet (25 mg total) by mouth every 6 (six)  hours as needed for anxiety. 40 tablet 2  . Omega-3 Fatty Acids (FISH OIL) 1000 MG CAPS Take 1 capsule by mouth daily.    . Red Yeast Rice 600 MG CAPS Take 1 capsule by mouth daily.    . valACYclovir (VALTREX) 500 MG tablet Take 1 tablet (500 mg total) by mouth daily. 30 tablet 11  . escitalopram (LEXAPRO) 10 MG tablet Take 1 tablet (10 mg total) by mouth daily. 30 tablet 11   No current facility-administered medications for this visit.     Review of Systems Review of Systems  Constitutional: Negative.   Respiratory: Negative.   Cardiovascular: Negative.     Blood pressure 118/68, pulse 65, resp. rate 12, height 5\' 1"  (1.549 m), weight 125 lb (56.7 kg), SpO2 96 %.  Physical Exam Physical Exam  Constitutional: She is oriented to person, place, and time. She appears well-developed and well-nourished.  HENT:  Mouth/Throat: Oropharynx is clear and moist.  Eyes: Conjunctivae are normal. No scleral icterus.  Neck: Neck supple.  Cardiovascular: Normal rate, regular rhythm and normal heart sounds.  Pulmonary/Chest: Effort normal and breath sounds normal. Right breast exhibits no inverted nipple, no mass,  no nipple discharge, no skin change and no tenderness. Left breast exhibits no inverted nipple, no mass, no nipple discharge, no skin change and no tenderness.  Lymphadenopathy:    She has no cervical adenopathy.    She has no axillary adenopathy.  Neurological: She is alert and oriented to person, place, and time.  Skin: Skin is warm and dry.  Psychiatric: Her behavior is normal.    Data Reviewed Ultrasound examination of February 22, 2017 showed multiple cystic areas in the right breast between the 12 and 2 o'clock position.  Repeat examination today shows a single 0.31 x 0.33 x 0.45 simple cyst in the 2 o'clock position of the right breast 3 cm from the nipple.  Good posterior acoustic enhancement.  Sharp edge effect.  BI-RADS-2.  Assessment    Asymptomatic breast cyst.  Marked  improvement in general state of well-being's with job change.    Plan    Follow up in December 2019 with bilateral screening mammograms.      HPI, Physical Exam, Assessment and Plan have been scribed under the direction and in the presence of Earline Mayotte, MD. Dorathy Daft, RN  I have completed the exam and reviewed the above documentation for accuracy and completeness.  I agree with the above.  Museum/gallery conservator has been used and any errors in dictation or transcription are unintentional.  Donnalee Curry, M.D., F.A.C.S.  Merrily Pew Byrnett 07/25/2017, 6:19 PM

## 2017-07-25 ENCOUNTER — Ambulatory Visit: Payer: BLUE CROSS/BLUE SHIELD | Admitting: Obstetrics and Gynecology

## 2017-07-25 ENCOUNTER — Encounter: Payer: Self-pay | Admitting: Obstetrics and Gynecology

## 2017-07-25 VITALS — BP 110/60 | HR 78 | Wt 125.0 lb

## 2017-07-25 DIAGNOSIS — F419 Anxiety disorder, unspecified: Secondary | ICD-10-CM

## 2017-07-25 DIAGNOSIS — N951 Menopausal and female climacteric states: Secondary | ICD-10-CM

## 2017-07-25 MED ORDER — ESCITALOPRAM OXALATE 10 MG PO TABS: 10.0000 mg | ORAL_TABLET | Freq: Every day | ORAL | 11 refills | Status: DC

## 2017-07-25 NOTE — Progress Notes (Signed)
Obstetrics & Gynecology Office Visit   Chief Complaint:  Chief Complaint  Patient presents with  . Follow-up    Medication follow up    History of Present Illness: The patient is a 62 y.o. female presenting follow up for symptoms of depression.  The patient is currently taking Lexapro 10mg  for the management of her symptoms.  She has not had any recent situational stressors.  She reports symptoms of reports complete resolution of symptoms. She denies anhedonia, day time somnolence, insomnia, risk taking behavior, irritability, increased appetite, decreased appetite, social anxiety, agorophobia, feelings of guilt, feelings of worthlessness, suicidal ideation, homicidal ideation, auditory hallucinations and visual hallucinations. Symptoms have improved since last visit.     The patient does not have a pre-existing history of depression and anxiety.    Still having vasomotor symptoms since discontinuing HRT.   Review of Systems: Review of Systems  Gastrointestinal: Negative for nausea.  Neurological: Negative for headaches.  Psychiatric/Behavioral: Negative for depression, hallucinations, memory loss, substance abuse and suicidal ideas. The patient is not nervous/anxious and does not have insomnia.      Past Medical History:  Past Medical History:  Diagnosis Date  . Arthritis    hips, legs  . Motion sickness    boats  . Osteoporosis   . Palpitations    with too much caffeine    Past Surgical History:  Past Surgical History:  Procedure Laterality Date  . BREAST BIOPSY Left 06/30/2014   u/s bx/clip- neg  . BREAST SURGERY Left 06/30/2014   Vacuum assisted biopsy, fibrocystic changes.  . COLONOSCOPY    . COLONOSCOPY WITH PROPOFOL N/A 02/25/2016   Procedure: COLONOSCOPY WITH PROPOFOL;  Surgeon: Midge Minium, MD;  Location: [facility];  Service: Endoscopy;  Laterality: N/A;  . TONSILLECTOMY  1972  . TUBAL LIGATION  1987    Gynecologic History: No LMP recorded.  Patient is postmenopausal.  Obstetric History: G1P1001  Family History:  Family History  Problem Relation Age of Onset  . Diabetes Mother        Type 2  . Hypertension Mother   . Heart disease Mother   . Lung cancer Brother   . Thyroid cancer Paternal Grandmother        Benign  . Breast cancer Neg Hx     Social History:  Social History   Socioeconomic History  . Marital status: Married    Spouse name: Not on file  . Number of children: Not on file  . Years of education: Not on file  . Highest education level: Not on file  Occupational History  . Not on file  Social Needs  . Financial resource strain: Not on file  . Food insecurity:    Worry: Not on file    Inability: Not on file  . Transportation needs:    Medical: Not on file    Non-medical: Not on file  Tobacco Use  . Smoking status: Never Smoker  . Smokeless tobacco: Never Used  Substance and Sexual Activity  . Alcohol use: Yes    Alcohol/week: 1.8 oz    Types: 3 Cans of beer per week  . Drug use: No  . Sexual activity: Yes    Birth control/protection: Post-menopausal  Lifestyle  . Physical activity:    Days per week: 4 days    Minutes per session: 40 min  . Stress: Very much  Relationships  . Social connections:    Talks on phone: More than three times a  week    Gets together: Once a week    Attends religious service: More than 4 times per year    Active member of club or organization: No    Attends meetings of clubs or organizations: Never    Relationship status: Married  . Intimate partner violence:    Fear of current or ex partner: No    Emotionally abused: No    Physically abused: No    Forced sexual activity: No  Other Topics Concern  . Not on file  Social History Narrative  . Not on file    Allergies:  Allergies  Allergen Reactions  . Penicillins Hives    Medications: Prior to Admission medications   Medication Sig Start Date End Date Taking? Authorizing Provider  Calcium  Carbonate-Vit D-Min (CALCIUM 1200 PO) Take 1 capsule by mouth daily.   Yes [provider]  Cholecalciferol (VITAMIN D3) 2000 UNITS CHEW Chew by mouth daily.   Yes [provider]  co-enzyme Q-10 30 MG capsule Take 60 mg by mouth daily.    Yes [provider]  escitalopram (LEXAPRO) 10 MG tablet Take 1 tablet (10 mg total) by mouth daily. 07/25/17  Yes Vena Austria, MD  hydrOXYzine (ATARAX/VISTARIL) 25 MG tablet Take 1 tablet (25 mg total) by mouth every 6 (six) hours as needed for anxiety. 02/12/17  Yes Vena Austria, MD  Omega-3 Fatty Acids (FISH OIL) 1000 MG CAPS Take 1 capsule by mouth daily.   Yes [provider]  Red Yeast Rice 600 MG CAPS Take 1 capsule by mouth daily.   Yes [provider]  valACYclovir (VALTREX) 500 MG tablet Take 1 tablet (500 mg total) by mouth daily. 12/08/16  Yes Vena Austria, MD    Physical Exam Vitals:  Vitals:   07/25/17 1407  BP: 110/60  Pulse: 78   No LMP recorded. Patient is postmenopausal.  General: NAD HEENT: normocephalic, anicteric Pulmonary: No increased work of breathing Neurologic: Grossly intact Psychiatric: mood appropriate, affect full    GAD 7 : Generalized Anxiety Score 07/25/2017  Nervous, Anxious, on Edge 1  Control/stop worrying 0  Worry too much - different things 0  Trouble relaxing 0  Restless 0  Easily annoyed or irritable 0  Afraid - awful might happen 0  Total GAD 7 Score 1  Anxiety Difficulty Not difficult at all    Depression screen [place] [facility] 2/9 07/25/2017  Decreased Interest 0  Down, Depressed, Hopeless 0  PHQ - 2 Score 0  Altered sleeping 2  Tired, decreased energy 0  Change in appetite 0  Feeling bad or failure about yourself  0  Trouble concentrating 0  Moving slowly or fidgety/restless 0  Suicidal thoughts 0  PHQ-9 Score 2  Difficult doing work/chores Not difficult at all    Depression screen [facility] 2/9 07/25/2017  Decreased Interest 0  Down,  Depressed, Hopeless 0  PHQ - 2 Score 0  Altered sleeping 2  Tired, decreased energy 0  Change in appetite 0  Feeling bad or failure about yourself  0  Trouble concentrating 0  Moving slowly or fidgety/restless 0  Suicidal thoughts 0  PHQ-9 Score 2  Difficult doing work/chores Not difficult at all     Assessment: 62 y.o. G1P1001 depression follow up  Plan: Problem List Items Addressed This Visit    None    Visit Diagnoses    Vasomotor symptoms due to menopause    -  Primary   Anxiety  Relevant Medications   escitalopram (LEXAPRO) 10 MG tablet      1) Doing well on current dose of Lexapro.  We discussed that should patient continue to do well, and given with new employment on of her main stressors have been removed.  Discussed if she wishes at current dose the patient can just come off Lexapro if she wishes.  2) We discussed WHI study findings in detail.  In the combined estrogen-progesterone arm breast cancer risk was increased by 1.26 (CI of 1.00 to 1.59), coronary heart disease 1.29 (CI 1.02-1.63), stroke risk 1.41 (1.07-1.85), and pulmonary embolism 2.13 (CI 1.39-3.25).  That being said the while statistically significant the actual number of cases attributable are relatively small at an addition 8 cases of breast cancer, 7 more coronary artery event, 8 more strokes, and 8 additional case of pulmonary embolism per 10,000 women.  Study was terminated because of the increased breast cancer risk, this was not seen in the progestin only arm of the study for women without an intact uterus.  In addition it is important to note that HRT also had positive or risk reducing effects, and all cause mortality between the HRT/non-HRT users is not statistically different.  Estrogen-progestin HRT decreased the relative risk of hip fracture 0.66 (CI 0.45-0.98), colorectal cancer 0.63 (0.43-0.92).  Current consensus is to limit dose to the lowest effective dose, and shortest treatment duration  possible.  Breast cancer risk appeared to increase after 4 years of use.  Also important to note is that these risk refer to systemic HRT for the treatment of vasomotor symptoms, and do not apply to vaginal preperations with minimal systemic absorption and aimed at treating symptoms of vulvovaginal atrophy.    We briefly touched on findings of WHIMS trial published in 2005 which looked at women 63 year of age or older, and whether HRT was protective against the development of dementia.  The study revealed that HRT actually increased the risk for the development of dementia but was limited by looking only at patients 63 years of age and older.  The subsequent KEEPS trial  In 2012 which looked at HRT in recently postmenopausal women did not show any improvement in cognitive function for women on HRT.  However, there was also no significant cognitive declines seen in recently postmenopausal women receiving HRT as had previously been shown in the [facility] trial.     3) A total of 15 minutes were spent in face-to-face contact with the patient during this encounter with over half of that time devoted to counseling and coordination of care.  4) Return in about 1 year (around 07/26/2018) for annual.    Vena AustriaAndreas Lashandra Arauz, MD, Merlinda FrederickFACOG [facility], [facility]

## 2017-07-25 NOTE — Patient Instructions (Signed)
We discussed WHI study findings in detail.  In the combined estrogen-progesterone arm breast cancer risk was increased by 1.26 (CI of 1.00 to 1.59), coronary heart disease 1.29 (CI 1.02-1.63), stroke risk 1.41 (1.07-1.85), and pulmonary embolism 2.13 (CI 1.39-3.25).  That being said the while statistically significant the actual number of cases attributable are relatively small at an addition 8 cases of breast cancer, 7 more coronary artery event, 8 more strokes, and 8 additional case of pulmonary embolism per 10,000 women.  Study was terminated because of the increased breast cancer risk, this was not seen in the progestin only arm of the study for women without an intact uterus.  In addition it is important to note that HRT also had positive or risk reducing effects, and all cause mortality between the HRT/non-HRT users is not statistically different.  Estrogen-progestin HRT decreased the relative risk of hip fracture 0.66 (CI 0.45-0.98), colorectal cancer 0.63 (0.43-0.92).  Current consensus is to limit dose to the lowest effective dose, and shortest treatment duration possible.  Breast cancer risk appeared to increase after 4 years of use.  Also important to note is that these risk refer to systemic HRT for the treatment of vasomotor symptoms, and do not apply to vaginal preperations with minimal systemic absorption and aimed at treating symptoms of vulvovaginal atrophy.    We briefly touched on findings of WHIMS trial published in 2005 which looked at women 65 year of age or older, and whether HRT was protective against the development of dementia.  The study revealed that HRT actually increased the risk for the development of dementia but was limited by looking only at patients 65 years of age and older.  The subsequent KEEPS trial  In 2012 which looked at HRT in recently postmenopausal women did not show any improvement in cognitive function for women on HRT.  However, there was also no significant  cognitive declines seen in recently postmenopausal women receiving HRT as had previously been shown in the WHIMS trial.    

## 2017-12-03 ENCOUNTER — Other Ambulatory Visit: Payer: Self-pay

## 2017-12-03 DIAGNOSIS — Z1231 Encounter for screening mammogram for malignant neoplasm of breast: Secondary | ICD-10-CM

## 2017-12-03 NOTE — Progress Notes (Signed)
.  scr

## 2018-01-09 IMAGING — MG DIGITAL SCREENING BILATERAL MAMMOGRAM WITH CAD
5 series · 5 of 5 positions shown · non-contrast
Comparison: Previous exam(s).

CLINICAL DATA: Screening.

EXAM:
DIGITAL SCREENING BILATERAL MAMMOGRAM WITH CAD

[L MLO (1 of 2)]
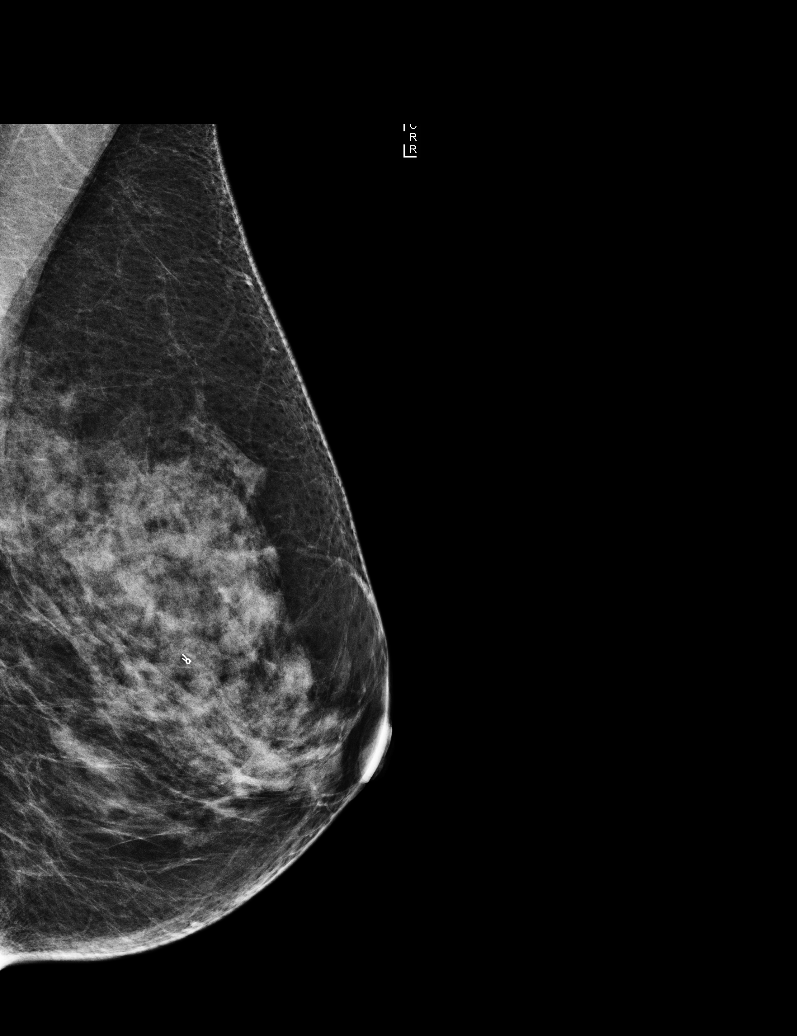

[R CC]
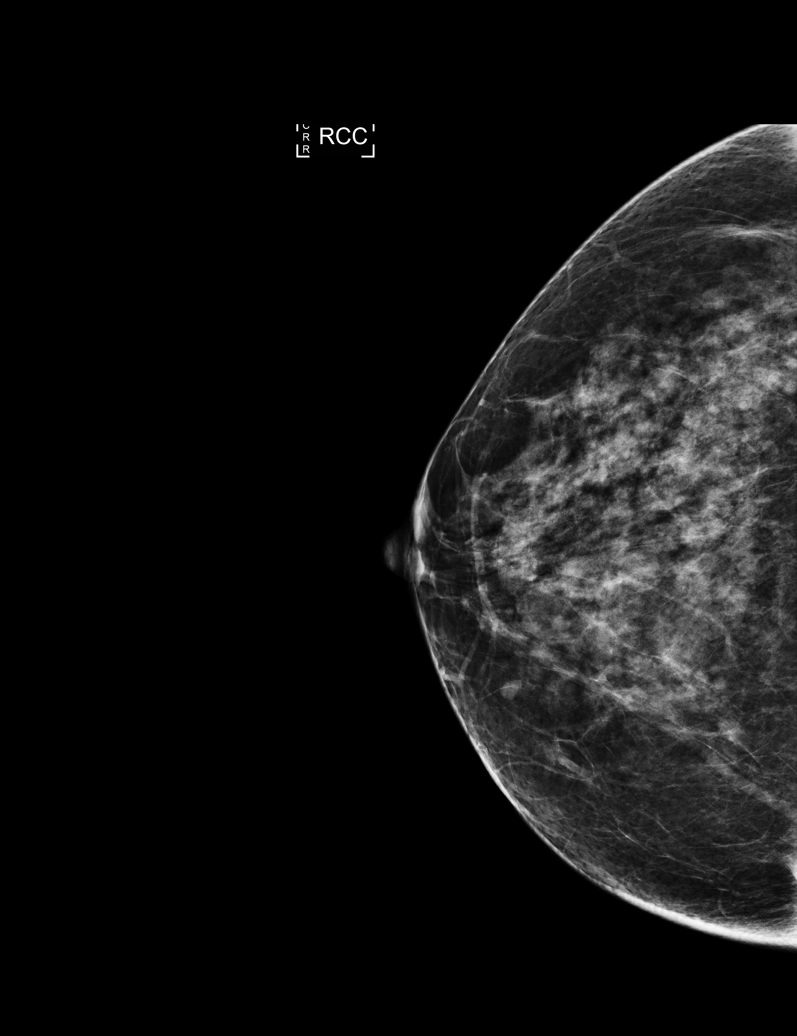

[L CC]
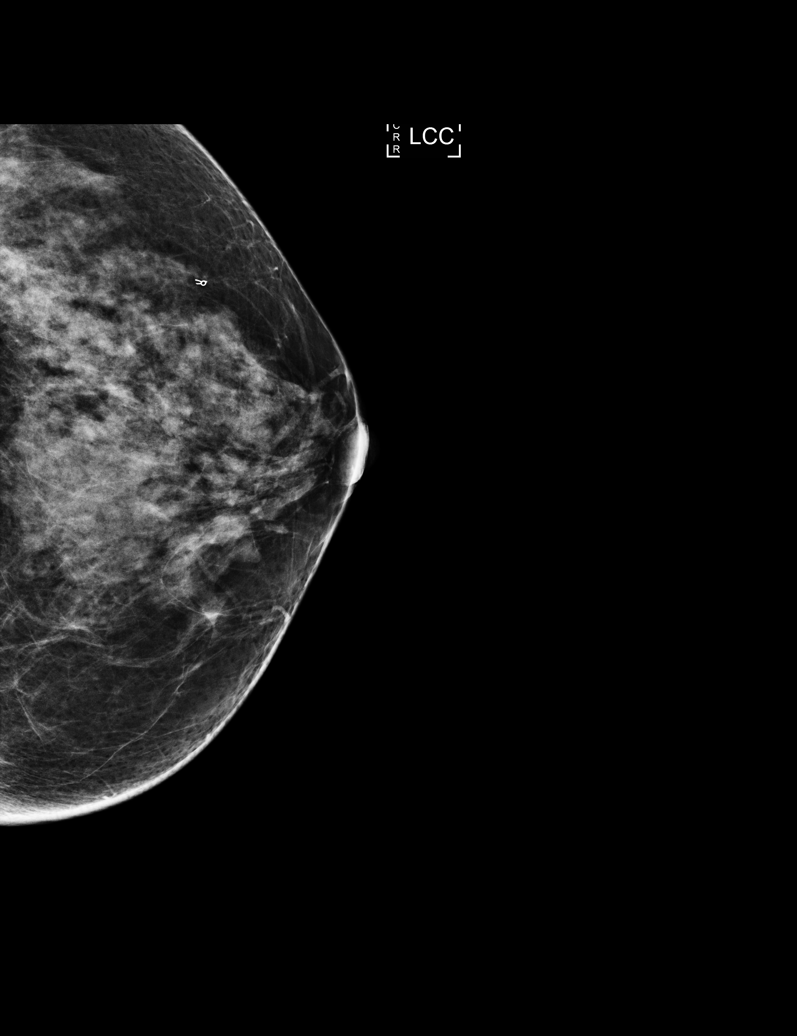

[L MLO (2 of 2)]
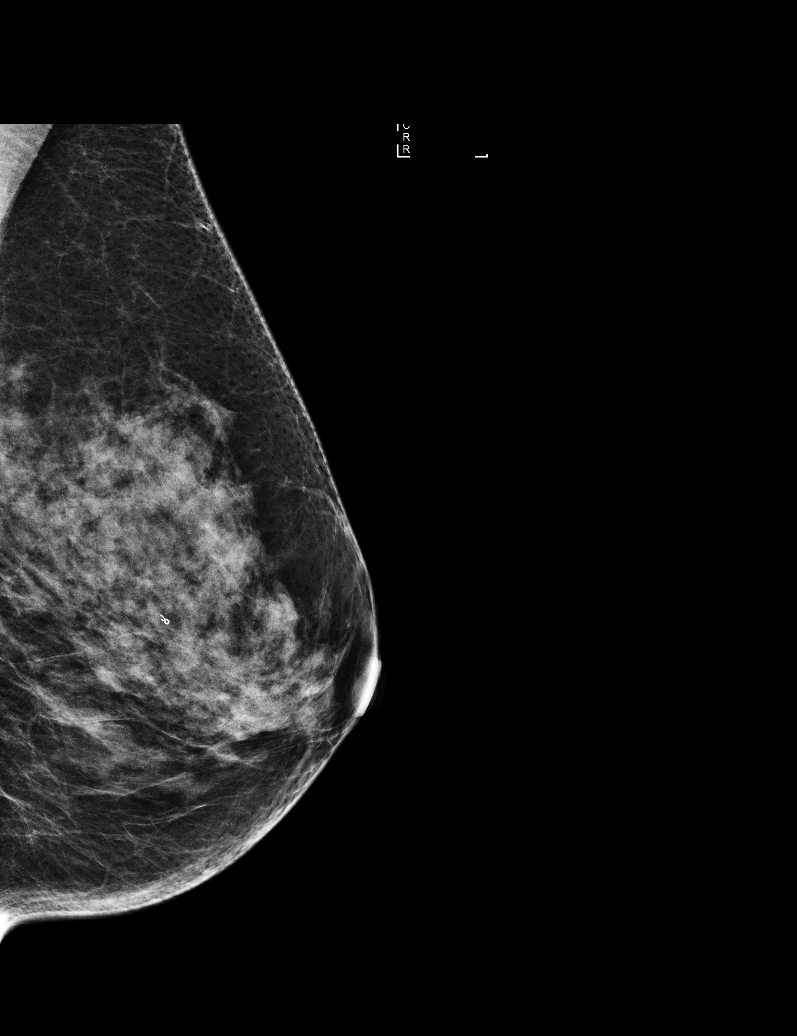

[R MLO]
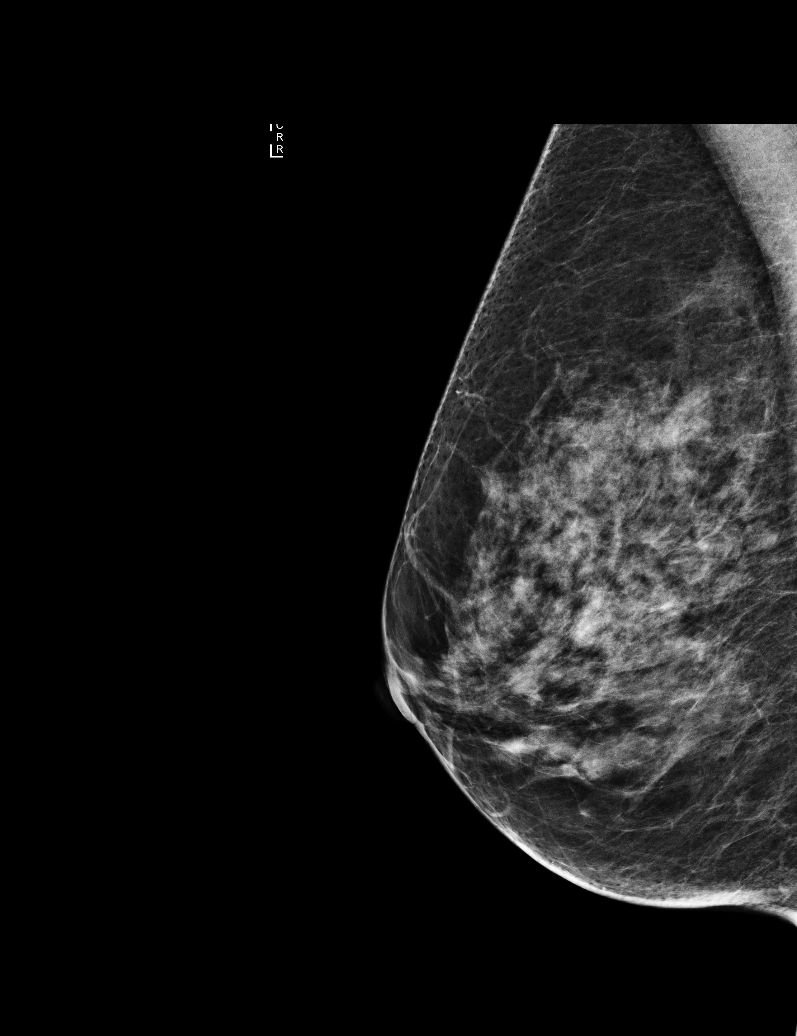

[5 of 5 positions shown; findings below may reference images not displayed]

ACR Breast Density Category c: The breast tissue is heterogeneously
dense, which may obscure small masses.
FINDINGS: In the right breast, a possible mass warrants further evaluation. In
the left breast, no findings suspicious for malignancy. Images were
processed with CAD.
IMPRESSION: Further evaluation is suggested for possible mass in the right
breast.

RECOMMENDATION:
Diagnostic mammogram and possibly ultrasound of the right breast.
(Code:[id number])

The patient will be contacted regarding the findings, and additional
imaging will be scheduled.

BI-RADS CATEGORY  0: Incomplete. Need additional imaging evaluation
and/or prior mammograms for comparison.

## 2018-01-21 ENCOUNTER — Ambulatory Visit
Admission: RE | Admit: 2018-01-21 | Discharge: 2018-01-21 | Disposition: A | Discharge status: Home / Self Care | Payer: BLUE CROSS/BLUE SHIELD | Source: Ambulatory Visit | Attending: General Surgery | Admitting: General Surgery

## 2018-01-21 DIAGNOSIS — Z1231 Encounter for screening mammogram for malignant neoplasm of breast: Secondary | ICD-10-CM | POA: Insufficient documentation

## 2018-01-31 ENCOUNTER — Encounter: Payer: Self-pay | Admitting: General Surgery

## 2018-01-31 ENCOUNTER — Ambulatory Visit: Payer: BLUE CROSS/BLUE SHIELD | Admitting: General Surgery

## 2018-01-31 ENCOUNTER — Other Ambulatory Visit: Payer: Self-pay

## 2018-01-31 VITALS — BP 132/72 | HR 74 | Resp 12 | Ht 61.0 in | Wt 135.0 lb

## 2018-01-31 DIAGNOSIS — Z1231 Encounter for screening mammogram for malignant neoplasm of breast: Secondary | ICD-10-CM | POA: Diagnosis not present

## 2018-01-31 NOTE — Patient Instructions (Addendum)
The patient is aware to call back for any questions or new concerns.    Patient will be asked to return to her primary care, Dr Chauncey CruelStabler, in one year with a bilateral screening mammogram.

## 2018-01-31 NOTE — Progress Notes (Signed)
Patient ID: Natasha KocherNancy L Andrews, female   DOB: 07/19/1955, 62 y.o.   MRN: [id number]  Chief Complaint  Patient presents with  . Follow-up    HPI Natasha Andrews is a 62 y.o. female.  who presents for a breast evaluation. The most recent mammogram was done on 01-21-18.  The patient had been followed for multiple breast cysts. Patient does perform regular self breast checks and gets regular mammograms done.  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  No new breast issues.  HPI  Past Medical History:  Diagnosis Date  . Arthritis    hips, legs  . Motion sickness    boats  . Osteoporosis   . Palpitations    with too much caffeine    Past Surgical History:  Procedure Laterality Date  . BREAST BIOPSY Left 06/30/2014   u/s bx/clip- neg  . BREAST SURGERY Left 06/30/2014   Vacuum assisted biopsy, fibrocystic changes.  . COLONOSCOPY    . COLONOSCOPY WITH PROPOFOL N/A 02/25/2016   Procedure: COLONOSCOPY WITH PROPOFOL;  Surgeon: Midge Miniumarren Wohl, MD;  [facility];  Service: Endoscopy;  Laterality: N/A;  . TONSILLECTOMY  1972  . TUBAL LIGATION  1987    Family History  Problem Relation Age of Onset  . Diabetes Mother        Type 2  . Hypertension Mother   . Heart disease Mother   . Lung cancer Brother   . Thyroid cancer Paternal Grandmother        Benign  . Breast cancer Neg Hx     Social History Social History   Tobacco Use  . Smoking status: Never Smoker  . Smokeless tobacco: Never Used  Substance Use Topics  . Alcohol use: Yes    Alcohol/week: 3.0 standard drinks    Types: 3 Cans of beer per week  . Drug use: No    Allergies  Allergen Reactions  . Penicillins Hives    Current Outpatient Medications  Medication Sig Dispense Refill  . Calcium Carbonate-Vit D-Min (CALCIUM 1200 PO) Take 1 capsule by mouth daily.    . Cholecalciferol (VITAMIN D3) 2000 UNITS CHEW Chew  by mouth daily.    Marland Kitchen. co-enzyme Q-10 30 MG capsule Take 60 mg by mouth daily.     Marland Kitchen. escitalopram (LEXAPRO) 10 MG tablet Take 1 tablet (10 mg total) by mouth daily. 30 tablet 11  . hydrOXYzine (ATARAX/VISTARIL) 25 MG tablet Take 1 tablet (25 mg total) by mouth every 6 (six) hours as needed for anxiety. 40 tablet 2  . Omega-3 Fatty Acids (FISH OIL) 1000 MG CAPS Take 1 capsule by mouth daily.    . Red Yeast Rice 600 MG CAPS Take 1 capsule by mouth daily.    . valACYclovir (VALTREX) 500 MG tablet Take 1 tablet (500 mg total) by mouth daily. 30 tablet 11   No current facility-administered medications for this visit.     Review of Systems Review of Systems  Constitutional: Negative.   Respiratory: Negative.   Cardiovascular: Negative.     Blood pressure 132/72, pulse 74, resp. rate 12, height 5\' 1"  (1.549 m), weight 135 lb (61.2 kg), SpO2 99 %.  Physical Exam Physical Exam Exam conducted with a chaperone present.  Constitutional:      Appearance: She is well-developed.  Eyes:     General: No scleral icterus.    Conjunctiva/sclera: Conjunctivae normal.  Neck:  Musculoskeletal: Neck supple.  Cardiovascular:     Rate and Rhythm: Normal rate and regular rhythm.     Heart sounds: Normal heart sounds.  Pulmonary:     Effort: Pulmonary effort is normal.     Breath sounds: Normal breath sounds.  Chest:     Breasts:        Right: No inverted nipple, mass, nipple discharge, skin change or tenderness.        Left: No inverted nipple, mass, nipple discharge, skin change or tenderness.  Lymphadenopathy:     Cervical: No cervical adenopathy.  Skin:    General: Skin is warm and dry.  Neurological:     Mental Status: She is alert and oriented to person, place, and time.  Psychiatric:        Behavior: Behavior normal.     Data Reviewed Bilateral screening mammograms dated January 21, 2018 reviewed: BI-RADS-1.  Assessment    Benign breast exam.    Plan    Patient will be asked  to return to her primary care, Dr Chauncey CruelStabler, in one year with a bilateral screening mammogram. The patient is aware to call back for any questions or new concerns.      HPI, Physical Exam, Assessment and Plan have been scribed under the direction and in the presence of Earline MayotteJeffrey W. Tibor Lemmons, MD. Dorathy DaftMarsha Hatch, RN   Merrily PewJeffrey W Platon Arocho 02/01/2018, 6:51 AM

## 2018-02-14 ENCOUNTER — Other Ambulatory Visit: Payer: Self-pay | Admitting: Obstetrics and Gynecology

## 2018-02-14 NOTE — Telephone Encounter (Signed)
Pt needs annual scheduled for 07/2018

## 2018-07-03 DIAGNOSIS — I10 Essential (primary) hypertension: Secondary | ICD-10-CM | POA: Insufficient documentation

## 2018-09-10 ENCOUNTER — Other Ambulatory Visit: Payer: Self-pay | Admitting: Obstetrics and Gynecology

## 2019-03-06 ENCOUNTER — Other Ambulatory Visit: Payer: Self-pay | Admitting: Obstetrics and Gynecology

## 2019-03-06 DIAGNOSIS — Z1231 Encounter for screening mammogram for malignant neoplasm of breast: Secondary | ICD-10-CM

## 2019-03-13 ENCOUNTER — Ambulatory Visit
Admission: RE | Admit: 2019-03-13 | Discharge: 2019-03-13 | Disposition: A | Discharge status: Home / Self Care | Payer: Managed Care, Other (non HMO) | Source: Ambulatory Visit | Attending: Obstetrics and Gynecology | Admitting: Obstetrics and Gynecology

## 2019-03-13 ENCOUNTER — Other Ambulatory Visit: Payer: Self-pay

## 2019-03-13 DIAGNOSIS — Z1231 Encounter for screening mammogram for malignant neoplasm of breast: Secondary | ICD-10-CM | POA: Diagnosis present

## 2019-03-14 ENCOUNTER — Other Ambulatory Visit: Payer: Self-pay | Admitting: Obstetrics and Gynecology

## 2019-05-07 ENCOUNTER — Other Ambulatory Visit: Payer: Self-pay | Admitting: Obstetrics and Gynecology

## 2019-10-03 ENCOUNTER — Other Ambulatory Visit: Payer: Self-pay | Admitting: Obstetrics and Gynecology

## 2019-10-03 ENCOUNTER — Telehealth: Payer: Self-pay | Admitting: Obstetrics and Gynecology

## 2019-10-03 NOTE — Telephone Encounter (Signed)
Patient is scheduled for 10/31/19 with AMS

## 2019-10-03 NOTE — Telephone Encounter (Signed)
I haven't seen in over 2 years I have to assess in order to continue so she need a phone visit at the very least and she is due for pap smear

## 2019-10-03 NOTE — Telephone Encounter (Signed)
Please advise if refill is appropriate 

## 2019-10-15 ENCOUNTER — Other Ambulatory Visit: Payer: Self-pay

## 2019-10-15 MED ORDER — ESCITALOPRAM OXALATE 10 MG PO TABS: ORAL_TABLET | ORAL | 11 refills | Status: DC

## 2019-10-31 ENCOUNTER — Ambulatory Visit (INDEPENDENT_AMBULATORY_CARE_PROVIDER_SITE_OTHER): Payer: Managed Care, Other (non HMO) | Admitting: Obstetrics and Gynecology

## 2019-10-31 ENCOUNTER — Encounter: Payer: Self-pay | Admitting: Obstetrics and Gynecology

## 2019-10-31 ENCOUNTER — Other Ambulatory Visit: Payer: Self-pay

## 2019-10-31 VITALS — BP 112/76 | HR 76 | Ht 62.0 in | Wt 139.0 lb

## 2019-10-31 DIAGNOSIS — Z01419 Encounter for gynecological examination (general) (routine) without abnormal findings: Secondary | ICD-10-CM

## 2019-10-31 DIAGNOSIS — Z1321 Encounter for screening for nutritional disorder: Secondary | ICD-10-CM

## 2019-10-31 DIAGNOSIS — Z1239 Encounter for other screening for malignant neoplasm of breast: Secondary | ICD-10-CM | POA: Diagnosis not present

## 2019-10-31 DIAGNOSIS — Z131 Encounter for screening for diabetes mellitus: Secondary | ICD-10-CM

## 2019-10-31 DIAGNOSIS — Z124 Encounter for screening for malignant neoplasm of cervix: Secondary | ICD-10-CM

## 2019-10-31 DIAGNOSIS — Z13820 Encounter for screening for osteoporosis: Secondary | ICD-10-CM

## 2019-10-31 DIAGNOSIS — Z13220 Encounter for screening for lipoid disorders: Secondary | ICD-10-CM

## 2019-10-31 DIAGNOSIS — Z1329 Encounter for screening for other suspected endocrine disorder: Secondary | ICD-10-CM

## 2019-10-31 NOTE — Patient Instructions (Signed)
[facility] [address] [place] [address]  [facility] [place]  [address] [place] [address]  Phone: [phone]  

## 2019-10-31 NOTE — Progress Notes (Signed)
Gynecology Annual Exam  PCP: Patient, No Pcp Per  Chief Complaint:  Chief Complaint  Patient presents with  . Gynecologic Exam    History of Present Illness:Patient is a 64 y.o. G1P1001 presents for annual exam. The patient has no complaints today.   LMP: No LMP recorded. Patient is postmenopausal. No PMB concerns   The patient does perform self breast exams.  There is no notable family history of breast or ovarian cancer in her family.  The patient wears seatbelts: yes.   The patient has regular exercise: not asked.    The patient denies current symptoms of depression.     Review of Systems: Review of Systems  Constitutional: Negative for chills and fever.  HENT: Negative for congestion.   Respiratory: Negative for cough and shortness of breath.   Cardiovascular: Negative for chest pain and palpitations.  Gastrointestinal: Negative for abdominal pain, constipation, diarrhea, heartburn, nausea and vomiting.  Genitourinary: Negative for dysuria, frequency and urgency.  Skin: Negative for itching and rash.  Neurological: Negative for dizziness and headaches.  Endo/Heme/Allergies: Negative for polydipsia.  Psychiatric/Behavioral: Negative for depression.    Past Medical History:  Patient Active Problem List   Diagnosis Date Noted  . Special screening for malignant neoplasms, colon   . Breast cyst, right 06/02/2014    Past Surgical History:  Past Surgical History:  Procedure Laterality Date  . BREAST BIOPSY Left 06/30/2014   u/s bx/clip- neg  . BREAST SURGERY Left 06/30/2014   Vacuum assisted biopsy, fibrocystic changes.  . COLONOSCOPY    . COLONOSCOPY WITH PROPOFOL N/A 02/25/2016   Procedure: COLONOSCOPY WITH PROPOFOL;  Surgeon: Midge Minium, MD;  Location: [facility];  Service: Endoscopy;  Laterality: N/A;  . TONSILLECTOMY  1972  . TUBAL LIGATION  1987    Gynecologic History:  No LMP recorded. Patient is postmenopausal. Last Pap: Results were:  11/27/2016 NIL and HR HPV negative  Last mammogram: 03/13/2019 Results were: BI-RAD I  Obstetric History: G1P1001  Family History:  Family History  Problem Relation Age of Onset  . Diabetes Mother        Type 2  . Hypertension Mother   . Heart disease Mother   . Lung cancer Brother   . Thyroid cancer Paternal Grandmother        Benign  . Breast cancer Neg Hx     Social History:  Social History   Socioeconomic History  . Marital status: Married    Spouse name: Not on file  . Number of children: Not on file  . Years of education: Not on file  . Highest education level: Not on file  Occupational History  . Not on file  Tobacco Use  . Smoking status: Never Smoker  . Smokeless tobacco: Never Used  Vaping Use  . Vaping Use: Never used  Substance and Sexual Activity  . Alcohol use: Yes    Alcohol/week: 3.0 standard drinks    Types: 3 Cans of beer per week  . Drug use: No  . Sexual activity: Yes    Birth control/protection: Post-menopausal  Other Topics Concern  . Not on file  Social History Narrative  . Not on file   Social Determinants of Health   Financial Resource Strain:   . Difficulty of Paying Living Expenses: Not on file  Food Insecurity:   . Worried About Programme researcher, broadcasting/film/video in the Last Year: Not on file  . Ran Out of Food in the Last Year: Not  on file  Transportation Needs:   . Lack of Transportation (Medical): Not on file  . Lack of Transportation (Non-Medical): Not on file  Physical Activity:   . Days of Exercise per Week: Not on file  . Minutes of Exercise per Session: Not on file  Stress:   . Feeling of Stress : Not on file  Social Connections:   . Frequency of Communication with Friends and Family: Not on file  . Frequency of Social Gatherings with Friends and Family: Not on file  . Attends Religious Services: Not on file  . Active Member of Clubs or Organizations: Not on file  . Attends Banker Meetings: Not on file  . Marital  Status: Not on file  Intimate Partner Violence:   . Fear of Current or Ex-Partner: Not on file  . Emotionally Abused: Not on file  . Physically Abused: Not on file  . Sexually Abused: Not on file    Allergies:  Allergies  Allergen Reactions  . Penicillins Hives    Medications: Prior to Admission medications   Medication Sig Start Date End Date Taking? Authorizing Provider  Calcium Carbonate-Vit D-Min (CALCIUM 1200 PO) Take 1 capsule by mouth daily.   Yes [provider]  Cholecalciferol (VITAMIN D3) 2000 UNITS CHEW Chew by mouth daily.   Yes [provider]  co-enzyme Q-10 30 MG capsule Take 60 mg by mouth daily.    Yes [provider]  escitalopram (LEXAPRO) 10 MG tablet TAKE 1 TABLET(10 MG) BY MOUTH DAILY 10/15/19  Yes Vena Austria, MD  hydrOXYzine (ATARAX/VISTARIL) 25 MG tablet Take 1 tablet (25 mg total) by mouth every 6 (six) hours as needed for anxiety. 02/12/17  Yes Vena Austria, MD  Omega-3 Fatty Acids (FISH OIL) 1000 MG CAPS Take 1 capsule by mouth daily.   Yes [provider]  Red Yeast Rice 600 MG CAPS Take 1 capsule by mouth daily.   Yes [provider]  valACYclovir (VALTREX) 500 MG tablet TAKE 1 TABLET(500 MG) BY MOUTH DAILY 03/14/19  Yes Vena Austria, MD    Physical Exam Vitals: Blood pressure 112/76, pulse 76, height 5\' 2"  (1.575 m), weight 139 lb (63 kg).  General: NAD HEENT: normocephalic, anicteric Thyroid: no enlargement, no palpable nodules Pulmonary: No increased work of breathing, CTAB Cardiovascular: RRR, distal pulses 2+ Breast: Breast symmetrical, no tenderness, no palpable nodules or masses, no skin or nipple retraction present, no nipple discharge.  No axillary or supraclavicular lymphadenopathy. Abdomen: NABS, soft, non-tender, non-distended.  Umbilicus without lesions.  No hepatomegaly, splenomegaly or masses palpable. No evidence of hernia  Genitourinary:  External: Normal external female  genitalia.  Normal urethral meatus, normal Bartholin's and Skene's glands.    Vagina: Normal vaginal mucosa, no evidence of prolapse.    Cervix: Grossly normal in appearance, no bleeding  Uterus: Non-enlarged, mobile, normal contour.  No CMT  Adnexa: ovaries non-enlarged, no adnexal masses  Rectal: deferred  Lymphatic: no evidence of inguinal lymphadenopathy Extremities: no edema, erythema, or tenderness Neurologic: Grossly intact Psychiatric: mood appropriate, affect full  Female chaperone present for pelvic and breast  portions of the physical exam   There is no immunization history on file for this patient.    Assessment: 64 y.o. G1P1001 routine annual exam  Plan: Problem List Items Addressed This Visit    None    Visit Diagnoses    Encounter for gynecological examination without abnormal finding    -  Primary   Screening for malignant neoplasm of  cervix       Relevant Orders   IGP, Aptima HPV, rfx 16/18,45   Breast screening       Relevant Orders   MM 3D SCREEN BREAST BILATERAL   Osteoporosis screening       Relevant Orders   DG Bone Density   Screening for diabetes mellitus       Relevant Orders   CMP14+LP+TP+TSH+CBC/Plt   Lipid screening       Relevant Orders   CMP14+LP+TP+TSH+CBC/Plt   Thyroid disorder screening       Relevant Orders   CMP14+LP+TP+TSH+CBC/Plt   Encounter for vitamin deficiency screening       Relevant Orders   Vitamin D (25 hydroxy)      1) Mammogram - recommend yearly screening mammogram.  Mammogram Is up to date  2) STI screening  was notoffered and therefore not obtained  3) ASCCP guidelines and rational discussed.  Patient opts for every 3 years screening interval  4) Osteoporosis  - per USPTF routine screening DEXA at age 30 - ordered  5) Routine healthcare maintenance including cholesterol, diabetes screening discussed Ordered today  6) Return in about 2 weeks (around 11/14/2019) for annual.    Vena Austria,  MD Domingo Pulse, [facility] 10/31/2019, 10:21 AM

## 2019-11-01 LAB — CMP14+LP+TP+TSH+CBC/PLT
ALT: 16 IU/L (ref 0–32)
AST: 16 IU/L (ref 0–40)
Albumin/Globulin Ratio: 2 (ref 1.2–2.2)
Albumin: 4.6 g/dL (ref 3.8–4.8)
Alkaline Phosphatase: 71 IU/L (ref 44–121)
BUN/Creatinine Ratio: 16 (ref 12–28)
BUN: 15 mg/dL (ref 8–27)
Bilirubin Total: 0.4 mg/dL (ref 0.0–1.2)
CO2: 23 mmol/L (ref 20–29)
Calcium: 9.5 mg/dL (ref 8.7–10.3)
Chloride: 100 mmol/L (ref 96–106)
Cholesterol, Total: 201 mg/dL — ABNORMAL HIGH (ref 100–199)
Creatinine, Ser: 0.94 mg/dL (ref 0.57–1.00)
Free Thyroxine Index: 1.2 (ref 1.2–4.9)
GFR calc Af Amer: 74 mL/min/{1.73_m2} (ref >59)
GFR calc non Af Amer: 64 mL/min/{1.73_m2} (ref >59)
Globulin, Total: 2.3 g/dL (ref 1.5–4.5)
Glucose: 95 mg/dL (ref 65–99)
HDL: 79 mg/dL (ref >39)
Hematocrit: 44.9 % (ref 34.0–46.6)
Hemoglobin: 15.1 g/dL (ref 11.1–15.9)
LDL Chol Calc (NIH): 108 mg/dL — ABNORMAL HIGH (ref 0–99)
LDL/HDL Ratio: 1.4 ratio (ref 0.0–3.2)
MCH: 30.8 pg (ref 26.6–33.0)
MCHC: 33.6 g/dL (ref 31.5–35.7)
MCV: 91 fL (ref 79–97)
Platelets: 242 10*3/uL (ref 150–450)
Potassium: 4.4 mmol/L (ref 3.5–5.2)
RBC: 4.91 x10E6/uL (ref 3.77–5.28)
RDW: 13 % (ref 11.7–15.4)
Sodium: 137 mmol/L (ref 134–144)
T3 Uptake Ratio: 24 % (ref 24–39)
T4, Total: 5.1 ug/dL (ref 4.5–12.0)
TSH: 2.03 u[IU]/mL (ref 0.450–4.500)
Total Protein: 6.9 g/dL (ref 6.0–8.5)
Triglycerides: 79 mg/dL (ref 0–149)
VLDL Cholesterol Cal: 14 mg/dL (ref 5–40)
WBC: 8.6 10*3/uL (ref 3.4–10.8)

## 2019-11-01 LAB — VITAMIN D 25 HYDROXY (VIT D DEFICIENCY, FRACTURES): Vit D, 25-Hydroxy: 51.5 ng/mL (ref 30.0–100.0)

## 2019-11-05 LAB — IGP, APTIMA HPV, RFX 16/18,45: HPV Aptima: NEGATIVE

## 2020-04-14 ENCOUNTER — Ambulatory Visit
Admission: RE | Admit: 2020-04-14 | Discharge: 2020-04-14 | Disposition: A | Discharge status: Home / Self Care | Payer: Managed Care, Other (non HMO) | Source: Ambulatory Visit | Attending: Obstetrics and Gynecology | Admitting: Obstetrics and Gynecology

## 2020-04-14 ENCOUNTER — Other Ambulatory Visit: Payer: Self-pay

## 2020-04-14 DIAGNOSIS — Z13820 Encounter for screening for osteoporosis: Secondary | ICD-10-CM | POA: Insufficient documentation

## 2020-04-14 DIAGNOSIS — Z1239 Encounter for other screening for malignant neoplasm of breast: Secondary | ICD-10-CM | POA: Diagnosis not present

## 2020-05-06 ENCOUNTER — Other Ambulatory Visit: Payer: Self-pay | Admitting: Obstetrics and Gynecology

## 2020-05-06 DIAGNOSIS — M810 Age-related osteoporosis without current pathological fracture: Secondary | ICD-10-CM

## 2020-05-06 NOTE — Progress Notes (Signed)
Opts for referral to rheumatology for consideration of reclast infusion

## 2020-08-09 ENCOUNTER — Other Ambulatory Visit: Payer: Self-pay | Admitting: Obstetrics and Gynecology

## 2020-09-16 ENCOUNTER — Other Ambulatory Visit: Payer: Self-pay | Admitting: Obstetrics and Gynecology

## 2021-01-14 ENCOUNTER — Other Ambulatory Visit: Payer: Self-pay

## 2021-01-14 ENCOUNTER — Ambulatory Visit (INDEPENDENT_AMBULATORY_CARE_PROVIDER_SITE_OTHER): Payer: Managed Care, Other (non HMO) | Admitting: Obstetrics and Gynecology

## 2021-01-14 ENCOUNTER — Encounter: Payer: Self-pay | Admitting: Obstetrics and Gynecology

## 2021-01-14 VITALS — BP 110/66 | HR 78 | Ht 61.0 in | Wt 138.0 lb

## 2021-01-14 DIAGNOSIS — Z1321 Encounter for screening for nutritional disorder: Secondary | ICD-10-CM

## 2021-01-14 DIAGNOSIS — R5383 Other fatigue: Secondary | ICD-10-CM

## 2021-01-14 DIAGNOSIS — M810 Age-related osteoporosis without current pathological fracture: Secondary | ICD-10-CM

## 2021-01-14 DIAGNOSIS — Z01419 Encounter for gynecological examination (general) (routine) without abnormal findings: Secondary | ICD-10-CM | POA: Diagnosis not present

## 2021-01-14 DIAGNOSIS — Z1239 Encounter for other screening for malignant neoplasm of breast: Secondary | ICD-10-CM

## 2021-01-14 DIAGNOSIS — Z0000 Encounter for general adult medical examination without abnormal findings: Secondary | ICD-10-CM

## 2021-01-14 DIAGNOSIS — Z13220 Encounter for screening for lipoid disorders: Secondary | ICD-10-CM

## 2021-01-14 DIAGNOSIS — Z1329 Encounter for screening for other suspected endocrine disorder: Secondary | ICD-10-CM

## 2021-01-14 NOTE — Progress Notes (Signed)
Gynecology Annual Exam  PCP: Patient, No Pcp Per (Inactive)  Chief Complaint:  Chief Complaint  Patient presents with   Gynecologic Exam    Annual - requesting labs. RM 5    History of Present Illness:Patient is a 65 y.o. G1P1001 presents for annual exam. The patient has no complaints today.   LMP: No LMP recorded. Patient is postmenopausal. No PMB  The patient does perform self breast exams.  There is no notable family history of breast or ovarian cancer in her family.  The patient wears seatbelts: yes.   The patient has regular exercise: not asked.    The patient denies current symptoms of depression.     Review of Systems: Review of Systems  Constitutional:  Negative for chills and fever.  HENT:  Negative for congestion.   Respiratory:  Negative for cough and shortness of breath.   Cardiovascular:  Negative for chest pain and palpitations.  Gastrointestinal:  Negative for abdominal pain, constipation, diarrhea, heartburn, nausea and vomiting.  Genitourinary:  Negative for dysuria, frequency and urgency.  Skin:  Negative for itching and rash.  Neurological:  Negative for dizziness and headaches.  Endo/Heme/Allergies:  Negative for polydipsia.  Psychiatric/Behavioral:  Negative for depression.    Past Medical History:  Patient Active Problem List   Diagnosis Date Noted   Special screening for malignant neoplasms, colon    Breast cyst, right 06/02/2014    Past Surgical History:  Past Surgical History:  Procedure Laterality Date   BREAST BIOPSY Left 06/30/2014   u/s bx/clip- neg   BREAST SURGERY Left 06/30/2014   Vacuum assisted biopsy, fibrocystic changes.   COLONOSCOPY     COLONOSCOPY WITH PROPOFOL N/A 02/25/2016   Procedure: COLONOSCOPY WITH PROPOFOL;  Surgeon: Midge Minium, MD;  Location: [facility];  Service: Endoscopy;  Laterality: N/A;   TONSILLECTOMY  1972   TUBAL LIGATION  1987    Gynecologic History:  No LMP recorded. Patient is  postmenopausal. Last Pap: Results were: 10/31/2019 NIL and HR HPV negative  Last mammogram: 04/14/2020 Results were: BI-RAD I  Obstetric History: G1P1001  Family History:  Family History  Problem Relation Age of Onset   Diabetes Mother        Type 2   Hypertension Mother    Heart disease Mother    Lung cancer Brother    Thyroid cancer Paternal Grandmother        Benign   Breast cancer Neg Hx     Social History:  Social History   Socioeconomic History   Marital status: Married    Spouse name: Not on file   Number of children: Not on file   Years of education: Not on file   Highest education level: Not on file  Occupational History   Not on file  Tobacco Use   Smoking status: Never   Smokeless tobacco: Never  Vaping Use   Vaping Use: Never used  Substance and Sexual Activity   Alcohol use: Yes    Alcohol/week: 3.0 standard drinks    Types: 3 Cans of beer per week   Drug use: No   Sexual activity: Yes    Birth control/protection: Post-menopausal  Other Topics Concern   Not on file  Social History Narrative   Not on file   Social Determinants of Health   Financial Resource Strain: Not on file  Food Insecurity: Not on file  Transportation Needs: Not on file  Physical Activity: Not on file  Stress: Not on  file  Social Connections: Not on file  Intimate Partner Violence: Not on file    Allergies:  Allergies  Allergen Reactions   Penicillins Hives    Medications: Prior to Admission medications   Medication Sig Start Date End Date Taking? Authorizing Provider  Calcium Carbonate-Vit D-Min (CALCIUM 1200 PO) Take 1 capsule by mouth daily.   Yes [provider]  Cholecalciferol (VITAMIN D3) 2000 UNITS CHEW Chew by mouth daily.   Yes [provider]  co-enzyme Q-10 30 MG capsule Take 60 mg by mouth daily.    Yes [provider]  escitalopram (LEXAPRO) 10 MG tablet TAKE 1 TABLET(10 MG) BY MOUTH DAILY 09/20/20  Yes Vena Austria, MD   Omega-3 Fatty Acids (FISH OIL) 1000 MG CAPS Take 1 capsule by mouth daily.   Yes [provider]  Red Yeast Rice 600 MG CAPS Take 1 capsule by mouth daily.   Yes [provider]  bimatoprost (LATISSE) 0.03 % ophthalmic solution Place 1 drop into the right eye at bedtime. 10/04/20   [provider]  hydrOXYzine (ATARAX/VISTARIL) 25 MG tablet Take 1 tablet (25 mg total) by mouth every 6 (six) hours as needed for anxiety. 02/12/17   Vena Austria, MD  meloxicam (MOBIC) 7.5 MG tablet Take 7.5 mg by mouth daily. 01/11/21   [provider]  valACYclovir (VALTREX) 500 MG tablet TAKE 1 TABLET(500 MG) BY MOUTH DAILY 08/10/20   Vena Austria, MD    Physical Exam Vitals: Blood pressure 110/66, pulse 78, height 5\' 1"  (1.549 m), weight 138 lb (62.6 kg).  General: NAD HEENT: normocephalic, anicteric Thyroid: no enlargement, no palpable nodules Pulmonary: No increased work of breathing, CTAB Cardiovascular: RRR, distal pulses 2+ Breast: Breast symmetrical, no tenderness, no palpable nodules or masses, no skin or nipple retraction present, no nipple discharge.  No axillary or supraclavicular lymphadenopathy. Abdomen: NABS, soft, non-tender, non-distended.  Umbilicus without lesions.  No hepatomegaly, splenomegaly or masses palpable. No evidence of hernia  Genitourinary:  External: Normal external female genitalia.  Normal urethral meatus, normal Bartholin's and Skene's glands.    Vagina: Normal vaginal mucosa, no evidence of prolapse.    Cervix: Grossly normal in appearance, no bleeding  Uterus: Non-enlarged, mobile, normal contour.  No CMT  Adnexa: ovaries non-enlarged, no adnexal masses  Rectal: deferred  Lymphatic: no evidence of inguinal lymphadenopathy Extremities: no edema, erythema, or tenderness Neurologic: Grossly intact Psychiatric: mood appropriate, affect full  Female chaperone present for pelvic and breast  portions of the physical exam      Assessment: 65 y.o. G1P1001 routine annual exam  Plan: Problem List Items Addressed This Visit   None Visit Diagnoses     Encounter for gynecological examination without abnormal finding    -  Primary   Breast screening       Relevant Orders   MM 3D SCREEN BREAST BILATERAL   Osteoporosis without current pathological fracture, unspecified osteoporosis type       Relevant Orders   Vitamin D (25 hydroxy)   Other fatigue       Relevant Orders   CBC With Differential   Comprehensive metabolic panel   TSH   Encounter for vitamin deficiency screening       Thyroid disorder screening       Relevant Orders   TSH   Lipid screening       Relevant Orders   Lipid panel   Blood tests for routine general physical examination       Relevant Orders  CBC With Differential   Comprehensive metabolic panel   Lipid panel   TSH   Vitamin D (25 hydroxy)       1) Mammogram - recommend yearly screening mammogram.  Mammogram Is up to date  2) STI screening  was notoffered and therefore not obtained  3) ASCCP guidelines and rational discussed.  Patient opts for yearly screening interval  4) Osteoporosis  - per USPTF routine screening DEXA at age 5 - referred to [facility] last year for reclast  5) Routine healthcare maintenance including cholesterol, diabetes screening discussed managed by PCP   7) Return in about 1 year (around 01/14/2022) for annual.    Vena Austria, MD Domingo Pulse, [facility] 01/14/2021, 10:06 AM

## 2021-01-14 NOTE — Patient Instructions (Signed)
[facility] [address] [place] [address]  [facility] [place]  [address] [place] [address]  Phone: [phone]  

## 2021-01-15 LAB — CBC WITH DIFFERENTIAL
Basophils Absolute: 0.1 10*3/uL (ref 0.0–0.2)
Basos: 1 %
EOS (ABSOLUTE): 0.4 10*3/uL (ref 0.0–0.4)
Eos: 4 %
Hematocrit: 45.6 % (ref 34.0–46.6)
Hemoglobin: 15 g/dL (ref 11.1–15.9)
Immature Grans (Abs): 0 10*3/uL (ref 0.0–0.1)
Immature Granulocytes: 0 %
Lymphocytes Absolute: 3 10*3/uL (ref 0.7–3.1)
Lymphs: 38 %
MCH: 30.2 pg (ref 26.6–33.0)
MCHC: 32.9 g/dL (ref 31.5–35.7)
MCV: 92 fL (ref 79–97)
Monocytes Absolute: 0.7 10*3/uL (ref 0.1–0.9)
Monocytes: 8 %
Neutrophils Absolute: 4 10*3/uL (ref 1.4–7.0)
Neutrophils: 49 %
RBC: 4.97 x10E6/uL (ref 3.77–5.28)
RDW: 12.5 % (ref 11.7–15.4)
WBC: 8.1 10*3/uL (ref 3.4–10.8)

## 2021-01-15 LAB — COMPREHENSIVE METABOLIC PANEL
ALT: 13 IU/L (ref 0–32)
AST: 15 IU/L (ref 0–40)
Albumin/Globulin Ratio: 2.4 — ABNORMAL HIGH (ref 1.2–2.2)
Albumin: 4.6 g/dL (ref 3.8–4.8)
Alkaline Phosphatase: 56 IU/L (ref 44–121)
BUN/Creatinine Ratio: 18 (ref 12–28)
BUN: 17 mg/dL (ref 8–27)
Bilirubin Total: 0.4 mg/dL (ref 0.0–1.2)
CO2: 25 mmol/L (ref 20–29)
Calcium: 9.8 mg/dL (ref 8.7–10.3)
Chloride: 101 mmol/L (ref 96–106)
Creatinine, Ser: 0.94 mg/dL (ref 0.57–1.00)
Globulin, Total: 1.9 g/dL (ref 1.5–4.5)
Glucose: 89 mg/dL (ref 70–99)
Potassium: 4.5 mmol/L (ref 3.5–5.2)
Sodium: 139 mmol/L (ref 134–144)
Total Protein: 6.5 g/dL (ref 6.0–8.5)
eGFR: 67 mL/min/{1.73_m2} (ref >59)

## 2021-01-15 LAB — LIPID PANEL
Chol/HDL Ratio: 2.3 ratio (ref 0.0–4.4)
Cholesterol, Total: 197 mg/dL (ref 100–199)
HDL: 85 mg/dL (ref >39)
LDL Chol Calc (NIH): 99 mg/dL (ref 0–99)
Triglycerides: 71 mg/dL (ref 0–149)
VLDL Cholesterol Cal: 13 mg/dL (ref 5–40)

## 2021-01-15 LAB — TSH: TSH: 1.14 u[IU]/mL (ref 0.450–4.500)

## 2021-01-15 LAB — VITAMIN D 25 HYDROXY (VIT D DEFICIENCY, FRACTURES): Vit D, 25-Hydroxy: 44.4 ng/mL (ref 30.0–100.0)

## 2021-02-01 ENCOUNTER — Telehealth: Payer: Self-pay

## 2021-02-01 NOTE — Telephone Encounter (Signed)
Pt calling; has tested positive for covid; AMS told her to let him know if she did get it; she thinks he will set up a transfusion or something.  [phone]

## 2021-02-02 ENCOUNTER — Encounter: Payer: Self-pay | Admitting: Obstetrics and Gynecology

## 2021-02-02 ENCOUNTER — Other Ambulatory Visit: Payer: Self-pay | Admitting: Obstetrics and Gynecology

## 2021-02-02 MED ORDER — NIRMATRELVIR/RITONAVIR (PAXLOVID)TABLET: 3.0000 | ORAL_TABLET | Freq: Two times a day (BID) | ORAL | 0 refills | Status: AC

## 2021-02-02 NOTE — Telephone Encounter (Signed)
AMS called patient and sent in Rx.

## 2021-02-02 NOTE — Progress Notes (Signed)
Positive home COVID antigen test, Rx paxlovid called in and FDA patient information sheet sent via mychart

## 2021-02-12 ENCOUNTER — Encounter: Payer: Self-pay | Admitting: Obstetrics and Gynecology

## 2021-02-14 ENCOUNTER — Encounter: Payer: Self-pay | Admitting: Obstetrics and Gynecology

## 2021-03-01 ENCOUNTER — Encounter: Payer: Self-pay | Admitting: Obstetrics and Gynecology

## 2021-03-01 ENCOUNTER — Other Ambulatory Visit: Payer: Self-pay | Admitting: Obstetrics and Gynecology

## 2021-03-01 MED ORDER — HYDROXYZINE HCL 25 MG PO TABS: 25.0000 mg | ORAL_TABLET | Freq: Four times a day (QID) | ORAL | 2 refills | Status: AC | PRN

## 2021-04-18 ENCOUNTER — Ambulatory Visit
Admission: RE | Admit: 2021-04-18 | Discharge: 2021-04-18 | Disposition: A | Discharge status: Home / Self Care | Payer: Managed Care, Other (non HMO) | Source: Ambulatory Visit | Attending: Obstetrics and Gynecology | Admitting: Obstetrics and Gynecology

## 2021-04-18 ENCOUNTER — Other Ambulatory Visit: Payer: Self-pay

## 2021-04-18 DIAGNOSIS — Z1231 Encounter for screening mammogram for malignant neoplasm of breast: Secondary | ICD-10-CM | POA: Insufficient documentation

## 2021-04-18 DIAGNOSIS — Z1239 Encounter for other screening for malignant neoplasm of breast: Secondary | ICD-10-CM

## 2021-12-01 ENCOUNTER — Telehealth: Payer: Self-pay

## 2021-12-01 NOTE — Telephone Encounter (Signed)
Natasha Andrews called triage line and left a voicemail stating she needed a refill on her Escitalopram 10 MG.

## 2021-12-01 NOTE — Telephone Encounter (Signed)
Called Natasha Andrews and told her she would need an appointment to see a provider

## 2021-12-02 ENCOUNTER — Ambulatory Visit: Payer: Managed Care, Other (non HMO) | Admitting: Advanced Practice Midwife

## 2021-12-02 ENCOUNTER — Encounter: Payer: Self-pay | Admitting: Advanced Practice Midwife

## 2021-12-02 VITALS — BP 145/65 | HR 77 | Ht 62.0 in | Wt 141.0 lb

## 2021-12-02 DIAGNOSIS — F32A Depression, unspecified: Secondary | ICD-10-CM | POA: Diagnosis not present

## 2021-12-02 DIAGNOSIS — F419 Anxiety disorder, unspecified: Secondary | ICD-10-CM | POA: Diagnosis not present

## 2021-12-02 MED ORDER — ESCITALOPRAM OXALATE 10 MG PO TABS: ORAL_TABLET | ORAL | 3 refills | Status: DC

## 2021-12-02 NOTE — Patient Instructions (Signed)
Medication Management and Mental Health Conditions You will learn about medication management, and the role of medication when you have a mental health condition. To view the content, go to this web address: [email or web address].[email or web address]  This video will expire on: 08/06/2023. If you need access to this video following this date, please reach out to the healthcare provider who assigned it to you. This information is not intended to replace advice given to you by your health care provider. Make sure you discuss any questions you have with your health care provider. Elsevier Patient Education  [place].

## 2021-12-05 ENCOUNTER — Encounter: Payer: Self-pay | Admitting: Advanced Practice Midwife

## 2021-12-05 NOTE — Progress Notes (Signed)
Patient ID: Natasha Andrews, female   DOB: 08-Nov-1955, 66 y.o.   MRN: [id number]  Reason for Consult: Medication Follow-up   Subjective:  Date of Service: 12/02/2021  HPI:  Natasha Andrews is a 66 y.o. female being seen for refill of antidepressant. She was previously seen by Dr. Georgianne Fick for management of the same and has been on the medication for several years. She reports doing well at this time and denies current symptoms of depression. We discussed best practice with regards to this class of medication- to wean off as appropriate. She will consider and prefers to continue on the medication at this time. She reports generally healthy lifestyle although due to work at home she has been eating more and gaining weight. Her last PAP was normal 2 years ago. Mammogram was normal in March of this year. Colonoscopy is up to date.  Past Medical History:  Diagnosis Date   Arthritis    hips, legs   Motion sickness    boats   Osteoporosis    Palpitations    with too much caffeine   Family History  Problem Relation Age of Onset   Diabetes Mother        Type 2   Hypertension Mother    Heart disease Mother    Lung cancer Brother    Thyroid cancer Paternal Grandmother        Benign   Breast cancer Neg Hx    Past Surgical History:  Procedure Laterality Date   BREAST BIOPSY Left 06/30/2014   u/s bx/clip- neg   BREAST SURGERY Left 06/30/2014   Vacuum assisted biopsy, fibrocystic changes.   COLONOSCOPY     COLONOSCOPY WITH PROPOFOL N/A 02/25/2016   Procedure: COLONOSCOPY WITH PROPOFOL;  Surgeon: Lucilla Lame, MD;  Location: [place];  Service: Endoscopy;  Laterality: N/A;   TONSILLECTOMY  1972   TUBAL LIGATION  1987    Short Social History:  Social History   Tobacco Use   Smoking status: Never   Smokeless tobacco: Never  Substance Use Topics   Alcohol use: Yes    Alcohol/week: 3.0 standard drinks of alcohol    Types: 3 Cans of beer per week    Allergies  Allergen  Reactions   Penicillins Hives    Current Outpatient Medications  Medication Sig Dispense Refill   Calcium Carbonate-Vit D-Min (CALCIUM 1200 PO) Take 1 capsule by mouth daily.     Cholecalciferol (VITAMIN D3) 2000 UNITS CHEW Chew by mouth daily.     co-enzyme Q-10 30 MG capsule Take 60 mg by mouth daily.      Omega-3 Fatty Acids (FISH OIL) 1000 MG CAPS Take 1 capsule by mouth daily.     Red Yeast Rice 600 MG CAPS Take 1 capsule by mouth daily.     valACYclovir (VALTREX) 500 MG tablet TAKE 1 TABLET(500 MG) BY MOUTH DAILY 30 tablet 5   bimatoprost (LATISSE) 0.03 % ophthalmic solution Place 1 drop into the right eye at bedtime. (Patient not taking: Reported on 12/02/2021)     chlorhexidine (PERIDEX) 0.12 % solution SMARTSIG:By Mouth     escitalopram (LEXAPRO) 10 MG tablet Take 1 tablet by mouth daily 90 tablet 3   hydrOXYzine (ATARAX) 25 MG tablet Take 1 tablet (25 mg total) by mouth every 6 (six) hours as needed for anxiety. (Patient not taking: Reported on 12/02/2021) 40 tablet 2   meloxicam (MOBIC) 7.5 MG tablet Take 7.5 mg by mouth daily. (Patient not taking: Reported on  12/02/2021)     No current facility-administered medications for this visit.    Review of Systems  Constitutional:  Negative for chills and fever.  HENT:  Negative for congestion, ear discharge, ear pain, hearing loss, sinus pain and sore throat.   Eyes:  Negative for blurred vision and double vision.  Respiratory:  Negative for cough, shortness of breath and wheezing.   Cardiovascular:  Negative for chest pain, palpitations and leg swelling.  Gastrointestinal:  Negative for abdominal pain, blood in stool, constipation, diarrhea, heartburn, melena, nausea and vomiting.  Genitourinary:  Negative for dysuria, flank pain, frequency, hematuria and urgency.  Musculoskeletal:  Negative for back pain, joint pain and myalgias.  Skin:  Negative for itching and rash.  Neurological:  Negative for dizziness, tingling, tremors,  sensory change, speech change, focal weakness, seizures, loss of consciousness, weakness and headaches.  Endo/Heme/Allergies:  Negative for environmental allergies. Does not bruise/bleed easily.  Psychiatric/Behavioral:  Negative for depression, hallucinations, memory loss, substance abuse and suicidal ideas. The patient is not nervous/anxious and does not have insomnia.         Objective:  Objective   Vitals:   12/02/21 0936  BP: (!) 145/65  Pulse: 77  Weight: 141 lb (64 kg)  Height: 5\' 2"  (1.575 m)   Body mass index is 25.79 kg/m. Constitutional: Well nourished, well developed female in no acute distress.  HEENT: normal Skin: Warm and dry.  Cardiovascular: Regular rate and rhythm.   Extremity:  no edema   Respiratory: Clear to auscultation bilateral. Normal respiratory effort Psych: Alert and Oriented x3. No memory deficits. Normal mood and affect.    Data:    12/02/2021   10:04 AM 01/14/2021    9:52 AM 07/25/2017    2:15 PM  Depression screen PHQ 2/9  Decreased Interest 0 0 0  Down, Depressed, Hopeless 0 0 0  PHQ - 2 Score 0 0 0  Altered sleeping 1 0 2  Tired, decreased energy 0 1 0  Change in appetite 2 0 0  Feeling bad or failure about yourself  0 0 0  Trouble concentrating 0 0 0  Moving slowly or fidgety/restless 0 0 0  Suicidal thoughts 0 0 0  PHQ-9 Score 3 1 2   Difficult doing work/chores Not difficult at all Not difficult at all Not difficult at all        Assessment/Plan:     66 y.o. G1 P1 female: medication management  Reorder Lexapro Consider weaning off anti-depressant Return for annual exam in 6 months   [place] 12/05/2021, 11:07 AM

## 2022-01-15 NOTE — Progress Notes (Unsigned)
PCP: Patient, No Pcp Per   No chief complaint on file.   HPI:      Ms. Natasha Andrews is a 66 y.o. G1P1001 whose LMP was No LMP recorded. Patient is postmenopausal., presents today for her annual examination.  Her menses are absent due to menopause. She {does:18564} have vasomotor sx.   Sex activity: {sex active: 315163}. She {does:18564} have vaginal dryness.  Last Pap: 10/31/19 Results were: no abnormalities /neg HPV DNA.  Hx of STDs: {STD hx:14358}  Last mammogram: 04/18/21  Results were: normal--routine follow-up in 12 months There is no FH of breast cancer. There is no FH of ovarian cancer. The patient {does:18564} do self-breast exams.  Colonoscopy: 2018 with Dr. Servando Snare; Repeat due after 10*** years.  DEXA: referred fro reclast???  Tobacco use: {tob:20664} Alcohol use: {Alcohol:11675} No drug use Exercise: {exercise:31265}  She {does:18564} get adequate calcium and Vitamin D in her diet.  Normal fasting labs 12/22 On lexapro for anxiety/depression   Patient Active Problem List   Diagnosis Date Noted   Depression 12/02/2021   Anxiety 12/02/2021   Essential hypertension 07/03/2018   Situational depression 01/02/2017   Special screening for malignant neoplasms, colon     Past Surgical History:  Procedure Laterality Date   BREAST BIOPSY Left 06/30/2014   u/s bx/clip- neg   BREAST SURGERY Left 06/30/2014   Vacuum assisted biopsy, fibrocystic changes.   COLONOSCOPY     COLONOSCOPY WITH PROPOFOL N/A 02/25/2016   Procedure: COLONOSCOPY WITH PROPOFOL;  Surgeon: Midge Minium, MD;  [facility]: [facility];  Service: Endoscopy;  Laterality: N/A;   TONSILLECTOMY  1972   TUBAL LIGATION  1987    Family History  Problem Relation Age of Onset   Diabetes Mother        Type 2   Hypertension Mother    Heart disease Mother    Lung cancer Brother    Thyroid cancer Paternal Grandmother        Benign   Breast cancer Neg Hx     Social History   Socioeconomic  History   Marital status: Married    Spouse name: Not on file   Number of children: Not on file   Years of education: Not on file   Highest education level: Not on file  Occupational History   Not on file  Tobacco Use   Smoking status: Never   Smokeless tobacco: Never  Vaping Use   Vaping Use: Never used  Substance and Sexual Activity   Alcohol use: Yes    Alcohol/week: 3.0 standard drinks of alcohol    Types: 3 Cans of beer per week   Drug use: No   Sexual activity: Yes    Birth control/protection: Post-menopausal  Other Topics Concern   Not on file  Social History Narrative   Not on file   Social Determinants of Health   Financial Resource Strain: Not on file  Food Insecurity: Not on file  Transportation Needs: Not on file  Physical Activity: Sufficiently Active (02/12/2017)   Exercise Vital Sign    Days of Exercise per Week: 4 days    Minutes of Exercise per Session: 40 min  Stress: Stress Concern Present (02/12/2017)   [employer] of Occupational Health - Occupational Stress Questionnaire    Feeling of Stress : Very much  Social Connections: Moderately Integrated (02/12/2017)   Social Connection and Isolation Panel [NHANES]    Frequency of Communication with Friends and Family: More than three times a week  Frequency of Social Gatherings with Friends and Family: Once a week    Attends Religious Services: More than 4 times per year    Active Member of [employer] or Organizations: No    Attends Banker Meetings: Never    Marital Status: Married  Catering manager Violence: Not At Risk (02/12/2017)   Humiliation, Afraid, Rape, and Kick questionnaire    Fear of Current or Ex-Partner: No    Emotionally Abused: No    Physically Abused: No    Sexually Abused: No     Current Outpatient Medications:    bimatoprost (LATISSE) 0.03 % ophthalmic solution, Place 1 drop into the right eye at bedtime. (Patient not taking: Reported on 12/02/2021), Disp: , Rfl:     Calcium Carbonate-Vit D-Min (CALCIUM 1200 PO), Take 1 capsule by mouth daily., Disp: , Rfl:    chlorhexidine (PERIDEX) 0.12 % solution, SMARTSIG:By Mouth, Disp: , Rfl:    Cholecalciferol (VITAMIN D3) 2000 UNITS CHEW, Chew by mouth daily., Disp: , Rfl:    co-enzyme Q-10 30 MG capsule, Take 60 mg by mouth daily. , Disp: , Rfl:    escitalopram (LEXAPRO) 10 MG tablet, Take 1 tablet by mouth daily, Disp: 90 tablet, Rfl: 3   hydrOXYzine (ATARAX) 25 MG tablet, Take 1 tablet (25 mg total) by mouth every 6 (six) hours as needed for anxiety. (Patient not taking: Reported on 12/02/2021), Disp: 40 tablet, Rfl: 2   meloxicam (MOBIC) 7.5 MG tablet, Take 7.5 mg by mouth daily. (Patient not taking: Reported on 12/02/2021), Disp: , Rfl:    Omega-3 Fatty Acids (FISH OIL) 1000 MG CAPS, Take 1 capsule by mouth daily., Disp: , Rfl:    Red Yeast Rice 600 MG CAPS, Take 1 capsule by mouth daily., Disp: , Rfl:    valACYclovir (VALTREX) 500 MG tablet, TAKE 1 TABLET(500 MG) BY MOUTH DAILY, Disp: 30 tablet, Rfl: 5     ROS:  Review of Systems BREAST: No symptoms    Objective: There were no vitals taken for this visit.   OBGyn Exam  Results: No results found for this or any previous visit (from the past 24 hour(s)).  Assessment/Plan:  No diagnosis found.   No orders of the defined types were placed in this encounter.           GYN counsel {counseling: 16159}    F/U  No follow-ups on file.  Hawa Henly B. Morine Kohlman, PA-C 01/15/2022 8:58 AM

## 2022-01-17 ENCOUNTER — Encounter: Payer: Self-pay | Admitting: Obstetrics and Gynecology

## 2022-01-17 ENCOUNTER — Ambulatory Visit (INDEPENDENT_AMBULATORY_CARE_PROVIDER_SITE_OTHER): Payer: Managed Care, Other (non HMO) | Admitting: Obstetrics and Gynecology

## 2022-01-17 VITALS — BP 110/70 | Ht 62.0 in | Wt 142.0 lb

## 2022-01-17 DIAGNOSIS — Z1211 Encounter for screening for malignant neoplasm of colon: Secondary | ICD-10-CM

## 2022-01-17 DIAGNOSIS — F418 Other specified anxiety disorders: Secondary | ICD-10-CM | POA: Diagnosis not present

## 2022-01-17 DIAGNOSIS — Z0000 Encounter for general adult medical examination without abnormal findings: Secondary | ICD-10-CM

## 2022-01-17 DIAGNOSIS — Z13220 Encounter for screening for lipoid disorders: Secondary | ICD-10-CM

## 2022-01-17 DIAGNOSIS — Z1329 Encounter for screening for other suspected endocrine disorder: Secondary | ICD-10-CM | POA: Diagnosis not present

## 2022-01-17 DIAGNOSIS — Z1321 Encounter for screening for nutritional disorder: Secondary | ICD-10-CM | POA: Diagnosis not present

## 2022-01-17 DIAGNOSIS — Z01419 Encounter for gynecological examination (general) (routine) without abnormal findings: Secondary | ICD-10-CM

## 2022-01-17 DIAGNOSIS — M810 Age-related osteoporosis without current pathological fracture: Secondary | ICD-10-CM

## 2022-01-17 DIAGNOSIS — Z1231 Encounter for screening mammogram for malignant neoplasm of breast: Secondary | ICD-10-CM

## 2022-01-17 DIAGNOSIS — F419 Anxiety disorder, unspecified: Secondary | ICD-10-CM

## 2022-01-17 MED ORDER — ESCITALOPRAM OXALATE 10 MG PO TABS: ORAL_TABLET | ORAL | 3 refills | Status: DC

## 2022-01-17 NOTE — Patient Instructions (Addendum)
I value your feedback and you entrusting us with your care. If you get a [place] patient survey, I would appreciate you taking the time to let us know about your experience today. Thank you!  [facility]: [phone]      

## 2022-01-18 LAB — COMPREHENSIVE METABOLIC PANEL
ALT: 13 IU/L (ref 0–32)
AST: 16 IU/L (ref 0–40)
Albumin/Globulin Ratio: 2.4 — ABNORMAL HIGH (ref 1.2–2.2)
Albumin: 4.5 g/dL (ref 3.9–4.9)
Alkaline Phosphatase: 54 IU/L (ref 44–121)
BUN/Creatinine Ratio: 15 (ref 12–28)
BUN: 13 mg/dL (ref 8–27)
Bilirubin Total: 0.5 mg/dL (ref 0.0–1.2)
CO2: 22 mmol/L (ref 20–29)
Calcium: 9.3 mg/dL (ref 8.7–10.3)
Chloride: 102 mmol/L (ref 96–106)
Creatinine, Ser: 0.89 mg/dL (ref 0.57–1.00)
Globulin, Total: 1.9 g/dL (ref 1.5–4.5)
Glucose: 98 mg/dL (ref 70–99)
Potassium: 4.3 mmol/L (ref 3.5–5.2)
Sodium: 139 mmol/L (ref 134–144)
Total Protein: 6.4 g/dL (ref 6.0–8.5)
eGFR: 71 mL/min/{1.73_m2} (ref >59)

## 2022-01-18 LAB — TSH: TSH: 2.03 u[IU]/mL (ref 0.450–4.500)

## 2022-01-18 LAB — LIPID PANEL
Chol/HDL Ratio: 2.2 ratio (ref 0.0–4.4)
Cholesterol, Total: 194 mg/dL (ref 100–199)
HDL: 87 mg/dL (ref >39)
LDL Chol Calc (NIH): 93 mg/dL (ref 0–99)
Triglycerides: 80 mg/dL (ref 0–149)
VLDL Cholesterol Cal: 14 mg/dL (ref 5–40)

## 2022-01-18 LAB — VITAMIN D 25 HYDROXY (VIT D DEFICIENCY, FRACTURES): Vit D, 25-Hydroxy: 43.5 ng/mL (ref 30.0–100.0)

## 2022-06-20 ENCOUNTER — Other Ambulatory Visit: Payer: Self-pay

## 2022-06-20 ENCOUNTER — Ambulatory Visit
Admission: EM | Admit: 2022-06-20 | Discharge: 2022-06-20 | Disposition: A | Discharge status: Home / Self Care | Payer: Managed Care, Other (non HMO) | Attending: Physician Assistant | Admitting: Physician Assistant

## 2022-06-20 DIAGNOSIS — R0981 Nasal congestion: Secondary | ICD-10-CM | POA: Diagnosis not present

## 2022-06-20 DIAGNOSIS — J3489 Other specified disorders of nose and nasal sinuses: Secondary | ICD-10-CM | POA: Diagnosis not present

## 2022-06-20 DIAGNOSIS — R051 Acute cough: Secondary | ICD-10-CM

## 2022-06-20 DIAGNOSIS — J069 Acute upper respiratory infection, unspecified: Secondary | ICD-10-CM | POA: Diagnosis not present

## 2022-06-20 DIAGNOSIS — Z9109 Other allergy status, other than to drugs and biological substances: Secondary | ICD-10-CM

## 2022-06-20 MED ORDER — METHYLPREDNISOLONE 4 MG PO TBPK: ORAL_TABLET | ORAL | 0 refills | Status: AC

## 2022-06-20 MED ORDER — IPRATROPIUM BROMIDE 0.06 % NA SOLN: 2.0000 | Freq: Four times a day (QID) | NASAL | 0 refills | Status: DC

## 2022-06-20 MED ORDER — PROMETHAZINE-DM 6.25-15 MG/5ML PO SYRP: 5.0000 mL | ORAL_SOLUTION | Freq: Four times a day (QID) | ORAL | 0 refills | Status: DC | PRN

## 2022-06-20 NOTE — ED Triage Notes (Signed)
Cough nasal congestion face pressure and right ear pain x 1 wk. Denies fever

## 2022-06-20 NOTE — Discharge Instructions (Addendum)
URI/COLD SYMPTOMS: Your exam today is consistent with a viral illness. Antibiotics are not indicated at this time. Use medications as directed, including cough syrup, nasal saline, and decongestants. Your symptoms should improve over the next few days and resolve within 7-10 days. Increase rest and fluids. F/u if symptoms worsen or predominate such as sore throat, ear pain, productive cough, shortness of breath, or if you develop high fevers or worsening fatigue over the next several days.    Call or return if no improvement in 1 week and your nasal drainage becomes discolored.   Return if fever, worsening cough or shortness of breath.

## 2022-06-20 NOTE — ED Provider Notes (Signed)
[facility]    CSN: [id number] Arrival date & time: 06/20/22  0809      History   Chief Complaint Chief Complaint  Patient presents with   Otalgia   Facial Pain   Cough   Nasal Congestion    HPI Natasha Andrews is a 67 y.o. female presenting for approximate 1 week history of nasal congestion, sinus pressure, right-sided ear pain, cough.  Denies fever.  States nasal drainage is clear but has been a little bloody on the right side.  Denies any chest pain or shortness of breath.  States that her friend has been sick with similar symptoms and was diagnosed with bronchitis and given a Z-Pak.  Patient has tried allergy medicine without relief.  She states that she does have allergies and thought this was an allergy flareup but she does not really feel any better.  No other complaints.  HPI  Past Medical History:  Diagnosis Date   Arthritis    hips, legs   Motion sickness    boats   Osteoporosis    Palpitations    with too much caffeine    Patient Active Problem List   Diagnosis Date Noted   Anxiety and depression 12/02/2021   Anxiety 12/02/2021   Essential hypertension 07/03/2018   Situational depression 01/02/2017   Special screening for malignant neoplasms, colon     Past Surgical History:  Procedure Laterality Date   BREAST BIOPSY Left 06/30/2014   u/s bx/clip- neg   BREAST SURGERY Left 06/30/2014   Vacuum assisted biopsy, fibrocystic changes.   COLONOSCOPY     COLONOSCOPY WITH PROPOFOL N/A 02/25/2016   Procedure: COLONOSCOPY WITH PROPOFOL;  Surgeon: Midge Minium, MD;  Location: [facility];  Service: Endoscopy;  Laterality: N/A;   TONSILLECTOMY  1972   TUBAL LIGATION  1987    OB History     Gravida  1   Para  1   Term  1   Preterm      AB      Living  1      SAB      IAB      Ectopic      Multiple      Live Births  1        Obstetric Comments  1st Menstrual Cycle:  12 1st Pregnancy:  31            Home  Medications    Prior to Admission medications   Medication Sig Start Date End Date Taking? Authorizing Provider  ipratropium (ATROVENT) 0.06 % nasal spray Place 2 sprays into both nostrils 4 (four) times daily. 06/20/22  Yes Eusebio Friendly B, PA-C  methylPREDNISolone (MEDROL DOSEPAK) 4 MG TBPK tablet Take according to dosepack 06/20/22  Yes Shirlee Latch, PA-C  promethazine-dextromethorphan (PROMETHAZINE-DM) 6.25-15 MG/5ML syrup Take 5 mLs by mouth 4 (four) times daily as needed. 06/20/22  Yes Eusebio Friendly B, PA-C  Calcium Carbonate-Vit D-Min (CALCIUM 1200 PO) Take 1 capsule by mouth daily.    [provider]  Cholecalciferol (VITAMIN D3) 2000 UNITS CHEW Chew by mouth daily.    [provider]  co-enzyme Q-10 30 MG capsule Take 60 mg by mouth daily.     [provider]  escitalopram (LEXAPRO) 10 MG tablet Take 1 tablet by mouth daily 01/17/22   Copland, Alicia B, PA-C  hydrOXYzine (ATARAX) 25 MG tablet Take 1 tablet (25 mg total) by mouth every 6 (six) hours as needed for anxiety.  03/01/21   Vena Austria, MD  Omega-3 Fatty Acids (FISH OIL) 1000 MG CAPS Take 1 capsule by mouth daily.    [provider]  Red Yeast Rice 600 MG CAPS Take 1 capsule by mouth daily.    [provider]  valACYclovir (VALTREX) 500 MG tablet TAKE 1 TABLET(500 MG) BY MOUTH DAILY 08/10/20   Vena Austria, MD    Family History Family History  Problem Relation Age of Onset   Diabetes Mother        Type 2   Hypertension Mother    Heart disease Mother    Lung cancer Brother    Thyroid cancer Paternal Grandmother        Benign   Breast cancer Neg Hx     Social History Social History   Tobacco Use   Smoking status: Never   Smokeless tobacco: Never  Vaping Use   Vaping Use: Never used  Substance Use Topics   Alcohol use: Yes    Alcohol/week: 3.0 standard drinks of alcohol    Types: 3 Cans of beer per week   Drug use: No     Allergies   Penicillins   Review  of Systems Review of Systems  Constitutional:  Positive for fatigue. Negative for chills, diaphoresis and fever.  HENT:  Positive for congestion, ear pain, rhinorrhea and sinus pressure. Negative for sinus pain and sore throat.   Respiratory:  Positive for cough. Negative for shortness of breath.   Cardiovascular:  Negative for chest pain.  Gastrointestinal:  Negative for abdominal pain, nausea and vomiting.  Musculoskeletal:  Negative for arthralgias and myalgias.  Skin:  Negative for rash.  Neurological:  Positive for headaches. Negative for weakness.  Hematological:  Negative for adenopathy.     Physical Exam Triage Vital Signs ED Triage Vitals  Enc Vitals Group     BP 06/20/22 0818 (!) 151/81     Pulse Rate 06/20/22 0818 73     Resp 06/20/22 0818 20     Temp 06/20/22 0818 98.7 F (37.1 C)     Temp src --      SpO2 06/20/22 0818 100 %     Weight --      Height --      Head Circumference --      Peak Flow --      Pain Score 06/20/22 0819 0     Pain Loc --      Pain Edu? --      Excl. in GC? --    No data found.  Updated Vital Signs BP (!) 151/81   Pulse 73   Temp 98.7 F (37.1 C)   Resp 20   SpO2 100%      Physical Exam Vitals and nursing note reviewed.  Constitutional:      General: She is not in acute distress.    Appearance: Normal appearance. She is not ill-appearing or toxic-appearing.  HENT:     Head: Normocephalic and atraumatic.     Right Ear: Ear canal and external ear normal. A middle ear effusion is present.     Left Ear: Ear canal and external ear normal. A middle ear effusion is present.     Ears:     Comments: Cloudy TMs bilaterally.    Nose: Congestion present.     Mouth/Throat:     Mouth: Mucous membranes are moist.     Pharynx: Oropharynx is clear.  Eyes:     General: No scleral icterus.  Right eye: No discharge.        Left eye: No discharge.     Conjunctiva/sclera: Conjunctivae normal.  Cardiovascular:     Rate and Rhythm:  Normal rate and regular rhythm.     Heart sounds: Normal heart sounds.  Pulmonary:     Effort: Pulmonary effort is normal. No respiratory distress.     Breath sounds: Normal breath sounds.  Musculoskeletal:     Cervical back: Neck supple.  Skin:    General: Skin is dry.  Neurological:     General: No focal deficit present.     Mental Status: She is alert. Mental status is at baseline.     Motor: No weakness.     Gait: Gait normal.  Psychiatric:        Mood and Affect: Mood normal.        Behavior: Behavior normal.        Thought Content: Thought content normal.      UC Treatments / Results  Labs (all labs ordered are listed, but only abnormal results are displayed) Labs Reviewed - No data to display  EKG   Radiology No results found.  Procedures Procedures (including critical care time)  Medications Ordered in UC Medications - No data to display  Initial Impression / Assessment and Plan / UC Course  I have reviewed the triage vital signs and the nursing notes.  Pertinent labs & imaging results that were available during my care of the patient were reviewed by me and considered in my medical decision making (see chart for details).   67 year old female with history of allergies presents for approximately 1 week history of nasal congestion, sinus pressure, right-sided ear pain, cough.  Reports clear nasal drainage and sputum.  No fever or breathing difficulty.  Has tried over-the-counter allergy meds without relief.  Also reports a sick contacts.  Patient has taken a COVID test and was negative.  She is afebrile and overall well-appearing.  No evidence of an ear infection.  Throat is clear.  Mild nasal congestion.  Chest clear auscultation.  Symptoms consistent with viral upper respiratory infection versus allergies.  Supportive care encouraged with increasing rest and fluids.  Sent Promethazine DM, Atrovent nasal spray and Medrol Dosepak to pharmacy.  Advised patient to  return if no improvement in the next week and developing discolored nasal drainage or sputum.  In that case may consider treatment with antibiotic such as doxycycline for sinus infection.  Reviewed returning sooner for acute worsening symptoms or fever.   Final Clinical Impressions(s) / UC Diagnoses   Final diagnoses:  Viral upper respiratory tract infection  Nasal congestion  Acute cough  Sinus pressure  Environmental allergies     Discharge Instructions      URI/COLD SYMPTOMS: Your exam today is consistent with a viral illness. Antibiotics are not indicated at this time. Use medications as directed, including cough syrup, nasal saline, and decongestants. Your symptoms should improve over the next few days and resolve within 7-10 days. Increase rest and fluids. F/u if symptoms worsen or predominate such as sore throat, ear pain, productive cough, shortness of breath, or if you develop high fevers or worsening fatigue over the next several days.    Call or return if no improvement in 1 week and your nasal drainage becomes discolored.   Return if fever, worsening cough or shortness of breath.     ED Prescriptions     Medication Sig Dispense Auth. Provider   promethazine-dextromethorphan (PROMETHAZINE-DM) 6.25-15  MG/5ML syrup Take 5 mLs by mouth 4 (four) times daily as needed. 118 mL Eusebio Friendly B, PA-C   methylPREDNISolone (MEDROL DOSEPAK) 4 MG TBPK tablet Take according to dosepack 21 tablet Eusebio Friendly B, PA-C   ipratropium (ATROVENT) 0.06 % nasal spray Place 2 sprays into both nostrils 4 (four) times daily. 15 mL Shirlee Latch, PA-C      PDMP not reviewed this encounter.   Shirlee Latch, PA-C 06/20/22 [phone]

## 2022-06-21 ENCOUNTER — Telehealth: Payer: Self-pay | Admitting: Family Medicine

## 2022-06-21 MED ORDER — PROMETHAZINE-DM 6.25-15 MG/5ML PO SYRP: 5.0000 mL | ORAL_SOLUTION | Freq: Four times a day (QID) | ORAL | 0 refills | Status: AC | PRN

## 2022-06-21 MED ORDER — IPRATROPIUM BROMIDE 0.06 % NA SOLN: 2.0000 | Freq: Four times a day (QID) | NASAL | 0 refills | Status: AC

## 2022-06-21 NOTE — Telephone Encounter (Signed)
Patient unable to pick up her medications at her preferred pharmacy as they are out of the promethazine-DM here.  Request medications be sent to [facility] pharmacy.  Katha Cabal, DO

## 2022-06-28 ENCOUNTER — Ambulatory Visit
Admission: RE | Admit: 2022-06-28 | Discharge: 2022-06-28 | Disposition: A | Discharge status: Home / Self Care | Payer: Managed Care, Other (non HMO) | Source: Ambulatory Visit | Attending: Obstetrics and Gynecology | Admitting: Obstetrics and Gynecology

## 2022-06-28 DIAGNOSIS — Z1231 Encounter for screening mammogram for malignant neoplasm of breast: Secondary | ICD-10-CM | POA: Insufficient documentation

## 2022-06-29 ENCOUNTER — Other Ambulatory Visit: Payer: Self-pay | Admitting: Obstetrics and Gynecology

## 2022-06-29 DIAGNOSIS — R928 Other abnormal and inconclusive findings on diagnostic imaging of breast: Secondary | ICD-10-CM

## 2022-07-06 ENCOUNTER — Ambulatory Visit
Admission: RE | Admit: 2022-07-06 | Discharge: 2022-07-06 | Disposition: A | Discharge status: Home / Self Care | Payer: Managed Care, Other (non HMO) | Source: Ambulatory Visit | Attending: Obstetrics and Gynecology | Admitting: Obstetrics and Gynecology

## 2022-07-06 DIAGNOSIS — R928 Other abnormal and inconclusive findings on diagnostic imaging of breast: Secondary | ICD-10-CM

## 2023-02-09 NOTE — Progress Notes (Unsigned)
PCP: Patient, No Pcp Per   No chief complaint on file.   HPI:      Natasha Andrews is a 67 y.o. G1P1001 whose LMP was No LMP recorded. Patient is postmenopausal., presents today for her annual examination.  Her menses are absent due to menopause. No PMB. Rare vasomotor sx.   Sex activity: single partner, contraception - post menopausal status. She does not have vaginal dryness.  Last Pap: 10/31/19 Results were: no abnormalities /neg HPV DNA. Hx of abn pap in past per pt   Last mammogram: 07/06/22  Results were: normal--routine follow-up in 12 months There is no FH of breast cancer. There is no FH of ovarian cancer. The patient does do self-breast exams.  Colonoscopy: 2018 with Dr. Servando Snare; Repeat due after 10 years.  DEXA: 3/22 at [facility] with osteoporosis in spine, osteopenia in hip. Seeing [facility] for reclast.   Tobacco use: The patient denies current or previous tobacco use. Alcohol use: social drinker No drug use Exercise: moderately active  She does get adequate calcium and Vitamin D in her diet.  Normal fasting labs 12/23; would like labs today. [employer] employee On lexapro for anxiety/depression with sx relief. Wants to continue.  Has urinary frequency with good flow/urgency at times. Drinks caffeine regularly and lots of water.   Patient Active Problem List   Diagnosis Date Noted   Anxiety and depression 12/02/2021   Anxiety 12/02/2021   Essential hypertension 07/03/2018   Situational depression 01/02/2017   Special screening for malignant neoplasms, colon     Past Surgical History:  Procedure Laterality Date   BREAST BIOPSY Left 06/30/2014   u/s bx/clip- neg   BREAST SURGERY Left 06/30/2014   Vacuum assisted biopsy, fibrocystic changes.   COLONOSCOPY     COLONOSCOPY WITH PROPOFOL N/A 02/25/2016   Procedure: COLONOSCOPY WITH PROPOFOL;  Surgeon: Midge Minium, MD;  Location: [facility];  Service: Endoscopy;  Laterality: N/A;   TONSILLECTOMY  1972   TUBAL  LIGATION  1987    Family History  Problem Relation Age of Onset   Diabetes Mother        Type 2   Hypertension Mother    Heart disease Mother    Lung cancer Brother    Thyroid cancer Paternal Grandmother        Benign   Breast cancer Neg Hx     Social History   Socioeconomic History   Marital status: Married    Spouse name: Not on file   Number of children: Not on file   Years of education: Not on file   Highest education level: Not on file  Occupational History   Not on file  Tobacco Use   Smoking status: Never   Smokeless tobacco: Never  Vaping Use   Vaping status: Never Used  Substance and Sexual Activity   Alcohol use: Yes    Alcohol/week: 3.0 standard drinks of alcohol    Types: 3 Cans of beer per week   Drug use: No   Sexual activity: Yes    Birth control/protection: Post-menopausal  Other Topics Concern   Not on file  Social History Narrative   Not on file   Social Drivers of Health   Financial Resource Strain: Not on file  Food Insecurity: Not on file  Transportation Needs: Not on file  Physical Activity: Sufficiently Active (02/12/2017)   Exercise Vital Sign    Days of Exercise per Week: 4 days    Minutes of Exercise  per Session: 40 min  Stress: Stress Concern Present (02/12/2017)   [employer] of Occupational Health - Occupational Stress Questionnaire    Feeling of Stress : Very much  Social Connections: Moderately Integrated (02/12/2017)   Social Connection and Isolation Panel [NHANES]    Frequency of Communication with Friends and Family: More than three times a week    Frequency of Social Gatherings with Friends and Family: Once a week    Attends Religious Services: More than 4 times per year    Active Member of [employer] or Organizations: No    Attends Banker Meetings: Never    Marital Status: Married  Catering manager Violence: Not At Risk (02/12/2017)   Humiliation, Afraid, Rape, and Kick questionnaire    Fear of Current  or Ex-Partner: No    Emotionally Abused: No    Physically Abused: No    Sexually Abused: No     Current Outpatient Medications:    Calcium Carbonate-Vit D-Min (CALCIUM 1200 PO), Take 1 capsule by mouth daily., Disp: , Rfl:    Cholecalciferol (VITAMIN D3) 2000 UNITS CHEW, Chew by mouth daily., Disp: , Rfl:    co-enzyme Q-10 30 MG capsule, Take 60 mg by mouth daily. , Disp: , Rfl:    escitalopram (LEXAPRO) 10 MG tablet, Take 1 tablet by mouth daily, Disp: 90 tablet, Rfl: 3   hydrOXYzine (ATARAX) 25 MG tablet, Take 1 tablet (25 mg total) by mouth every 6 (six) hours as needed for anxiety., Disp: 40 tablet, Rfl: 2   ipratropium (ATROVENT) 0.06 % nasal spray, Place 2 sprays into both nostrils 4 (four) times daily., Disp: 15 mL, Rfl: 0   methylPREDNISolone (MEDROL DOSEPAK) 4 MG TBPK tablet, Take according to dosepack, Disp: 21 tablet, Rfl: 0   Omega-3 Fatty Acids (FISH OIL) 1000 MG CAPS, Take 1 capsule by mouth daily., Disp: , Rfl:    promethazine-dextromethorphan (PROMETHAZINE-DM) 6.25-15 MG/5ML syrup, Take 5 mLs by mouth 4 (four) times daily as needed., Disp: 118 mL, Rfl: 0   Red Yeast Rice 600 MG CAPS, Take 1 capsule by mouth daily., Disp: , Rfl:    valACYclovir (VALTREX) 500 MG tablet, TAKE 1 TABLET(500 MG) BY MOUTH DAILY, Disp: 30 tablet, Rfl: 5     ROS:  Review of Systems  Constitutional:  Negative for fatigue, fever and unexpected weight change.  Respiratory:  Negative for cough, shortness of breath and wheezing.   Cardiovascular:  Negative for chest pain, palpitations and leg swelling.  Gastrointestinal:  Negative for blood in stool, constipation, diarrhea, nausea and vomiting.  Endocrine: Negative for cold intolerance, heat intolerance and polyuria.  Genitourinary:  Negative for dyspareunia, dysuria, flank pain, frequency, genital sores, hematuria, menstrual problem, pelvic pain, urgency, vaginal bleeding, vaginal discharge and vaginal pain.  Musculoskeletal:  Positive for  arthralgias. Negative for back pain, joint swelling and myalgias.  Skin:  Negative for rash.  Neurological:  Negative for dizziness, syncope, light-headedness, numbness and headaches.  Hematological:  Negative for adenopathy.  Psychiatric/Behavioral:  Positive for agitation. Negative for confusion, sleep disturbance and suicidal ideas. The patient is not nervous/anxious.    BREAST: No symptoms    Objective: There were no vitals taken for this visit.   Physical Exam Constitutional:      Appearance: She is well-developed.  Genitourinary:     Vulva normal.     Genitourinary Comments: MILD CYSTOCELE PRESENT     Right Labia: No rash, tenderness or lesions.    Left Labia: No tenderness, lesions or rash.  No vaginal discharge, erythema or tenderness.     Anterior vaginal prolapse present.     Right Adnexa: not tender and no mass present.    Left Adnexa: not tender and no mass present.    No cervical friability or polyp.     Uterus is not enlarged or tender.  Breasts:    Right: No mass, nipple discharge, skin change or tenderness.     Left: No mass, nipple discharge, skin change or tenderness.  Neck:     Thyroid: No thyromegaly.  Cardiovascular:     Rate and Rhythm: Normal rate and regular rhythm.     Heart sounds: Normal heart sounds. No murmur heard. Pulmonary:     Effort: Pulmonary effort is normal.     Breath sounds: Normal breath sounds.  Abdominal:     Palpations: Abdomen is soft.     Tenderness: There is no abdominal tenderness. There is no guarding or rebound.  Musculoskeletal:        General: Normal range of motion.     Cervical back: Normal range of motion.  Lymphadenopathy:     Cervical: No cervical adenopathy.  Neurological:     General: No focal deficit present.     Mental Status: She is alert and oriented to person, place, and time.     Cranial Nerves: No cranial nerve deficit.  Skin:    General: Skin is warm and dry.  Psychiatric:        Mood and  Affect: Mood normal.        Behavior: Behavior normal.        Thought Content: Thought content normal.        Judgment: Judgment normal.  Vitals reviewed.     Assessment/Plan:  Encounter for annual routine gynecological examination  Encounter for screening mammogram for malignant neoplasm of breast - Plan: MM 3D SCREEN BREAST BILATERAL; pt to schedule mammo  Age-related osteoporosis without current pathological fracture - Plan: Vitamin D (25 hydroxy); cont ca/Vit D; seeing endocrine for reclast tx  Anxiety and depression - Plan: escitalopram (LEXAPRO) 10 MG tablet; Rx RF. Doing well  Urinary urgency--decrease caffeine intake, void more frequently. F/u prn.   Blood tests for routine general physical examination - Plan: Comprehensive metabolic panel, TSH, Vitamin D (25 hydroxy), Lipid panel  Encounter for vitamin deficiency screening - Plan: Vitamin D (25 hydroxy)  Thyroid disorder screening - Plan: TSH  Lipid screening - Plan: Lipid panel   No orders of the defined types were placed in this encounter.          GYN counsel breast self exam, mammography screening, menopause, adequate intake of calcium and vitamin D, diet and exercise    F/U  No follow-ups on file.  Rosabelle Jupin B. Lacara Dunsworth, PA-C 02/09/2023 1:13 PM

## 2023-02-12 ENCOUNTER — Encounter: Payer: Self-pay | Admitting: Obstetrics and Gynecology

## 2023-02-12 ENCOUNTER — Ambulatory Visit (INDEPENDENT_AMBULATORY_CARE_PROVIDER_SITE_OTHER): Payer: Managed Care, Other (non HMO) | Admitting: Obstetrics and Gynecology

## 2023-02-12 VITALS — BP 161/83 | HR 68 | Ht 62.0 in | Wt 139.4 lb

## 2023-02-12 DIAGNOSIS — Z13220 Encounter for screening for lipoid disorders: Secondary | ICD-10-CM

## 2023-02-12 DIAGNOSIS — Z1329 Encounter for screening for other suspected endocrine disorder: Secondary | ICD-10-CM

## 2023-02-12 DIAGNOSIS — Z0000 Encounter for general adult medical examination without abnormal findings: Secondary | ICD-10-CM

## 2023-02-12 DIAGNOSIS — Z1151 Encounter for screening for human papillomavirus (HPV): Secondary | ICD-10-CM

## 2023-02-12 DIAGNOSIS — M810 Age-related osteoporosis without current pathological fracture: Secondary | ICD-10-CM

## 2023-02-12 DIAGNOSIS — Z124 Encounter for screening for malignant neoplasm of cervix: Secondary | ICD-10-CM

## 2023-02-12 DIAGNOSIS — Z1321 Encounter for screening for nutritional disorder: Secondary | ICD-10-CM

## 2023-02-12 DIAGNOSIS — Z1231 Encounter for screening mammogram for malignant neoplasm of breast: Secondary | ICD-10-CM

## 2023-02-12 DIAGNOSIS — Z01419 Encounter for gynecological examination (general) (routine) without abnormal findings: Secondary | ICD-10-CM

## 2023-02-12 DIAGNOSIS — Z131 Encounter for screening for diabetes mellitus: Secondary | ICD-10-CM

## 2023-02-12 DIAGNOSIS — F419 Anxiety disorder, unspecified: Secondary | ICD-10-CM

## 2023-02-12 DIAGNOSIS — R030 Elevated blood-pressure reading, without diagnosis of hypertension: Secondary | ICD-10-CM

## 2023-02-12 MED ORDER — ESCITALOPRAM OXALATE 10 MG PO TABS: ORAL_TABLET | ORAL | 3 refills | Status: DC

## 2023-02-12 NOTE — Patient Instructions (Signed)
I value your feedback and you entrusting us with your care. If you get a [facility] patient survey, I would appreciate you taking the time to let us know about your experience today. Thank you! ? ? ?

## 2023-02-13 LAB — COMPREHENSIVE METABOLIC PANEL
ALT: 15 [IU]/L (ref 0–32)
AST: 17 [IU]/L (ref 0–40)
Albumin: 4.7 g/dL (ref 3.9–4.9)
Alkaline Phosphatase: 61 [IU]/L (ref 44–121)
BUN/Creatinine Ratio: 16 (ref 12–28)
BUN: 14 mg/dL (ref 8–27)
Bilirubin Total: 0.4 mg/dL (ref 0.0–1.2)
CO2: 23 mmol/L (ref 20–29)
Calcium: 9.6 mg/dL (ref 8.7–10.3)
Chloride: 101 mmol/L (ref 96–106)
Creatinine, Ser: 0.9 mg/dL (ref 0.57–1.00)
Globulin, Total: 1.9 g/dL (ref 1.5–4.5)
Glucose: 93 mg/dL (ref 70–99)
Potassium: 4.4 mmol/L (ref 3.5–5.2)
Sodium: 140 mmol/L (ref 134–144)
Total Protein: 6.6 g/dL (ref 6.0–8.5)
eGFR: 70 mL/min/{1.73_m2} (ref >59)

## 2023-02-13 LAB — CBC WITH DIFFERENTIAL/PLATELET
Basophils Absolute: 0.1 10*3/uL (ref 0.0–0.2)
Basos: 1 %
EOS (ABSOLUTE): 0.3 10*3/uL (ref 0.0–0.4)
Eos: 4 %
Hematocrit: 47 % — ABNORMAL HIGH (ref 34.0–46.6)
Hemoglobin: 15.4 g/dL (ref 11.1–15.9)
Immature Grans (Abs): 0 10*3/uL (ref 0.0–0.1)
Immature Granulocytes: 0 %
Lymphocytes Absolute: 3 10*3/uL (ref 0.7–3.1)
Lymphs: 40 %
MCH: 30.4 pg (ref 26.6–33.0)
MCHC: 32.8 g/dL (ref 31.5–35.7)
MCV: 93 fL (ref 79–97)
Monocytes Absolute: 0.5 10*3/uL (ref 0.1–0.9)
Monocytes: 7 %
Neutrophils Absolute: 3.7 10*3/uL (ref 1.4–7.0)
Neutrophils: 48 %
Platelets: 262 10*3/uL (ref 150–450)
RBC: 5.06 x10E6/uL (ref 3.77–5.28)
RDW: 12.7 % (ref 11.7–15.4)
WBC: 7.7 10*3/uL (ref 3.4–10.8)

## 2023-02-13 LAB — LIPID PANEL
Chol/HDL Ratio: 2.7 {ratio} (ref 0.0–4.4)
Cholesterol, Total: 218 mg/dL — ABNORMAL HIGH (ref 100–199)
HDL: 82 mg/dL (ref >39)
LDL Chol Calc (NIH): 120 mg/dL — ABNORMAL HIGH (ref 0–99)
Triglycerides: 94 mg/dL (ref 0–149)
VLDL Cholesterol Cal: 16 mg/dL (ref 5–40)

## 2023-02-13 LAB — VITAMIN D 25 HYDROXY (VIT D DEFICIENCY, FRACTURES): Vit D, 25-Hydroxy: 40.8 ng/mL (ref 30.0–100.0)

## 2023-02-13 LAB — HEMOGLOBIN A1C
Est. average glucose Bld gHb Est-mCnc: 108 mg/dL
Hgb A1c MFr Bld: 5.4 % (ref 4.8–5.6)

## 2023-02-13 LAB — TSH+FREE T4
Free T4: 0.85 ng/dL (ref 0.82–1.77)
TSH: 1.78 u[IU]/mL (ref 0.450–4.500)

## 2023-02-15 LAB — IGP, APTIMA HPV: HPV Aptima: NEGATIVE

## 2023-02-15 NOTE — Progress Notes (Deleted)
 Patient, No Pcp Per   No chief complaint on file.   HPI:      Ms. Natasha Andrews is a 68 y.o. G1P1001 whose LMP was No LMP recorded. Patient is postmenopausal., presents today for ***  BP (!) 161/83 (BP Location: Right Arm, Patient Position: Sitting, Cuff Size: Normal)   Pulse 68   Ht 5' 2 (1.575 m)   Wt 139 lb 6.4 oz (63.2 kg)   BMI 25.50 kg/m  157/80 1st BP  Patient Active Problem List   Diagnosis Date Noted   Anxiety and depression 12/02/2021   Anxiety 12/02/2021   Essential hypertension 07/03/2018   Situational depression 01/02/2017   Special screening for malignant neoplasms, colon     Past Surgical History:  Procedure Laterality Date   BREAST BIOPSY Left 06/30/2014   u/s bx/clip- neg   BREAST SURGERY Left 06/30/2014   Vacuum assisted biopsy, fibrocystic changes.   COLONOSCOPY     COLONOSCOPY WITH PROPOFOL  N/A 02/25/2016   Procedure: COLONOSCOPY WITH PROPOFOL ;  Surgeon: Rogelia Copping, MD;  Location: [facility];  Service: Endoscopy;  Laterality: N/A;   TONSILLECTOMY  1972   TUBAL LIGATION  1987    Family History  Problem Relation Age of Onset   Diabetes Mother        Type 2   Hypertension Mother    Heart disease Mother    Lung cancer Brother    Thyroid  cancer Paternal Grandmother        Benign   Breast cancer Neg Hx     Social History   Socioeconomic History   Marital status: Married    Spouse name: Not on file   Number of children: Not on file   Years of education: Not on file   Highest education level: Not on file  Occupational History   Not on file  Tobacco Use   Smoking status: Never   Smokeless tobacco: Never  Vaping Use   Vaping status: Never Used  Substance and Sexual Activity   Alcohol use: Yes    Alcohol/week: 3.0 standard drinks of alcohol    Types: 3 Cans of beer per week   Drug use: No   Sexual activity: Yes    Birth control/protection: Post-menopausal  Other Topics Concern   Not on file  Social History Narrative    Not on file   Social Drivers of Health   Financial Resource Strain: Not on file  Food Insecurity: Not on file  Transportation Needs: Not on file  Physical Activity: Sufficiently Active (02/12/2017)   Exercise Vital Sign    Days of Exercise per Week: 4 days    Minutes of Exercise per Session: 40 min  Stress: Stress Concern Present (02/12/2017)   [employer] of Occupational Health - Occupational Stress Questionnaire    Feeling of Stress : Very much  Social Connections: Moderately Integrated (02/12/2017)   Social Connection and Isolation Panel [NHANES]    Frequency of Communication with Friends and Family: More than three times a week    Frequency of Social Gatherings with Friends and Family: Once a week    Attends Religious Services: More than 4 times per year    Active Member of [employer] or Organizations: No    Attends Banker Meetings: Never    Marital Status: Married  Catering Manager Violence: Not At Risk (02/12/2017)   Humiliation, Afraid, Rape, and Kick questionnaire    Fear of Current or Ex-Partner: No    Emotionally Abused:  No    Physically Abused: No    Sexually Abused: No    Outpatient Medications Prior to Visit  Medication Sig Dispense Refill   Calcium Carbonate-Vit D-Min (CALCIUM 1200 PO) Take 1 capsule by mouth daily.     Cholecalciferol (VITAMIN D3) 2000 UNITS CHEW Chew by mouth daily.     co-enzyme Q-10 30 MG capsule Take 60 mg by mouth daily.      escitalopram  (LEXAPRO ) 10 MG tablet Take 1 tablet by mouth daily 90 tablet 3   hydrOXYzine  (ATARAX ) 25 MG tablet Take 1 tablet (25 mg total) by mouth every 6 (six) hours as needed for anxiety. 40 tablet 2   ipratropium (ATROVENT ) 0.06 % nasal spray Place 2 sprays into both nostrils 4 (four) times daily. 15 mL 0   methylPREDNISolone  (MEDROL  DOSEPAK) 4 MG TBPK tablet Take according to dosepack 21 tablet 0   Omega-3 Fatty Acids (FISH OIL) 1000 MG CAPS Take 1 capsule by mouth daily.      promethazine -dextromethorphan (PROMETHAZINE -DM) 6.25-15 MG/5ML syrup Take 5 mLs by mouth 4 (four) times daily as needed. 118 mL 0   Red Yeast Rice 600 MG CAPS Take 1 capsule by mouth daily.     valACYclovir  (VALTREX ) 500 MG tablet TAKE 1 TABLET(500 MG) BY MOUTH DAILY 30 tablet 5   No facility-administered medications prior to visit.      ROS:  Review of Systems BREAST: No symptoms   OBJECTIVE:   Vitals:  There were no vitals taken for this visit.  Physical Exam  Results: No results found for this or any previous visit (from the past 24 hours).   Assessment/Plan: No diagnosis found.    No orders of the defined types were placed in this encounter.     No follow-ups on file.  Kezia Benevides B. Anton Cheramie, PA-C 02/15/2023 12:03 PM

## 2023-02-19 ENCOUNTER — Ambulatory Visit: Payer: Managed Care, Other (non HMO) | Admitting: Obstetrics and Gynecology

## 2023-02-19 NOTE — Progress Notes (Signed)
 Patient, No Pcp Per   Chief Complaint  Patient presents with   Blood Pressure Check    HPI:      Ms. Natasha Andrews is a 68 y.o. G1P1001 whose LMP was No LMP recorded. Patient is postmenopausal., presents today for BP recheck from 02/12/23 annual. BP was 161/83 and 157/80. Has been taking BP at home and in 130s/70s-low 80s. No recent HTN tx/dx. Started to exercise again. Has f/u with new PCP in a couple wks.   Past Medical History:  Diagnosis Date   Arthritis    hips, legs   Motion sickness    boats   Osteoporosis    Palpitations    with too much caffeine    Past Surgical History:  Procedure Laterality Date   BREAST BIOPSY Left 06/30/2014   u/s bx/clip- neg   BREAST SURGERY Left 06/30/2014   Vacuum assisted biopsy, fibrocystic changes.   COLONOSCOPY     COLONOSCOPY WITH PROPOFOL  N/A 02/25/2016   Procedure: COLONOSCOPY WITH PROPOFOL ;  Surgeon: Rogelia Copping, MD;  Location: [facility];  Service: Endoscopy;  Laterality: N/A;   TONSILLECTOMY  1972   TUBAL LIGATION  1987    Family History  Problem Relation Age of Onset   Diabetes Mother        Type 2   Hypertension Mother    Heart disease Mother    Lung cancer Brother    Thyroid  cancer Paternal Grandmother        Benign   Breast cancer Neg Hx     Social History   Socioeconomic History   Marital status: Married    Spouse name: Not on file   Number of children: Not on file   Years of education: Not on file   Highest education level: Not on file  Occupational History   Not on file  Tobacco Use   Smoking status: Never   Smokeless tobacco: Never  Vaping Use   Vaping status: Never Used  Substance and Sexual Activity   Alcohol use: Yes    Alcohol/week: 3.0 standard drinks of alcohol    Types: 3 Cans of beer per week   Drug use: No   Sexual activity: Yes    Birth control/protection: Post-menopausal  Other Topics Concern   Not on file  Social History Narrative   Not on file   Social Drivers of  Health   Financial Resource Strain: Not on file  Food Insecurity: Not on file  Transportation Needs: Not on file  Physical Activity: Sufficiently Active (02/12/2017)   Exercise Vital Sign    Days of Exercise per Week: 4 days    Minutes of Exercise per Session: 40 min  Stress: Stress Concern Present (02/12/2017)   [employer] of Occupational Health - Occupational Stress Questionnaire    Feeling of Stress : Very much  Social Connections: Moderately Integrated (02/12/2017)   Social Connection and Isolation Panel [NHANES]    Frequency of Communication with Friends and Family: More than three times a week    Frequency of Social Gatherings with Friends and Family: Once a week    Attends Religious Services: More than 4 times per year    Active Member of [employer] or Organizations: No    Attends Banker Meetings: Never    Marital Status: Married  Catering Manager Violence: Not At Risk (02/12/2017)   Humiliation, Afraid, Rape, and Kick questionnaire    Fear of Current or Ex-Partner: No    Emotionally Abused: No  Physically Abused: No    Sexually Abused: No    Outpatient Medications Prior to Visit  Medication Sig Dispense Refill   Calcium Carbonate-Vit D-Min (CALCIUM 1200 PO) Take 1 capsule by mouth daily.     Cholecalciferol (VITAMIN D3) 2000 UNITS CHEW Chew by mouth daily.     co-enzyme Q-10 30 MG capsule Take 60 mg by mouth daily.      escitalopram  (LEXAPRO ) 10 MG tablet Take 1 tablet by mouth daily 90 tablet 3   hydrOXYzine  (ATARAX ) 25 MG tablet Take 1 tablet (25 mg total) by mouth every 6 (six) hours as needed for anxiety. 40 tablet 2   ipratropium (ATROVENT ) 0.06 % nasal spray Place 2 sprays into both nostrils 4 (four) times daily. 15 mL 0   methylPREDNISolone  (MEDROL  DOSEPAK) 4 MG TBPK tablet Take according to dosepack 21 tablet 0   Omega-3 Fatty Acids (FISH OIL) 1000 MG CAPS Take 1 capsule by mouth daily.     promethazine -dextromethorphan (PROMETHAZINE -DM) 6.25-15  MG/5ML syrup Take 5 mLs by mouth 4 (four) times daily as needed. 118 mL 0   Red Yeast Rice 600 MG CAPS Take 1 capsule by mouth daily.     valACYclovir  (VALTREX ) 500 MG tablet TAKE 1 TABLET(500 MG) BY MOUTH DAILY 30 tablet 5   No facility-administered medications prior to visit.      ROS:  Review of Systems  Constitutional:  Negative for fatigue, fever and unexpected weight change.  Respiratory:  Negative for cough, shortness of breath and wheezing.   Cardiovascular:  Negative for chest pain, palpitations and leg swelling.  Gastrointestinal:  Negative for blood in stool, constipation, diarrhea, nausea and vomiting.  Endocrine: Negative for cold intolerance, heat intolerance and polyuria.  Genitourinary:  Negative for dyspareunia, dysuria, flank pain, frequency, genital sores, hematuria, menstrual problem, pelvic pain, urgency, vaginal bleeding, vaginal discharge and vaginal pain.  Musculoskeletal:  Negative for back pain, joint swelling and myalgias.  Skin:  Negative for rash.  Neurological:  Negative for dizziness, syncope, light-headedness, numbness and headaches.  Hematological:  Negative for adenopathy.  Psychiatric/Behavioral:  Negative for agitation, confusion, sleep disturbance and suicidal ideas. The patient is not nervous/anxious.    BREAST: No symptoms   OBJECTIVE:   Vitals:  BP (!) 144/75   Pulse 70   Ht 5' 2 (1.575 m)   Wt 144 lb (65.3 kg)   BMI 26.34 kg/m   Repeat 144/75  Physical Exam Vitals reviewed.  Constitutional:      Appearance: She is well-developed.  Pulmonary:     Effort: Pulmonary effort is normal.  Musculoskeletal:        General: Normal range of motion.     Cervical back: Normal range of motion.  Skin:    General: Skin is warm and dry.  Neurological:     General: No focal deficit present.     Mental Status: She is alert and oriented to person, place, and time.     Cranial Nerves: No cranial nerve deficit.  Psychiatric:        Mood and  Affect: Mood normal.        Behavior: Behavior normal.        Thought Content: Thought content normal.        Judgment: Judgment normal.    Assessment/Plan: Blood pressure check--BP WNL at home. Pt to cont BP diary and f/u with PCP. No dx of essential HTN today. Cont exercise.     Return in about 1 year (around 02/20/2024), or if  symptoms worsen or fail to improve, for annual.  Baxter Gonzalez B. Isham Smitherman, PA-C 02/20/2023 11:38 AM

## 2023-02-20 ENCOUNTER — Ambulatory Visit: Payer: Managed Care, Other (non HMO) | Admitting: Obstetrics and Gynecology

## 2023-02-20 ENCOUNTER — Encounter: Payer: Self-pay | Admitting: Obstetrics and Gynecology

## 2023-02-20 VITALS — BP 144/75 | HR 70 | Ht 62.0 in | Wt 144.0 lb

## 2023-02-20 DIAGNOSIS — Z0130 Encounter for examination of blood pressure without abnormal findings: Secondary | ICD-10-CM

## 2023-02-20 NOTE — Patient Instructions (Signed)
 I value your feedback and you entrusting [place] with your care. If you get a [facility] patient survey, I would appreciate you taking the time to let us know about your experience today. Thank you! ? ? ?

## 2023-03-05 ENCOUNTER — Ambulatory Visit: Payer: BLUE CROSS/BLUE SHIELD | Admitting: Nurse Practitioner

## 2023-08-22 ENCOUNTER — Encounter: Payer: Self-pay | Admitting: Obstetrics and Gynecology

## 2023-08-22 ENCOUNTER — Other Ambulatory Visit: Payer: Self-pay

## 2023-08-22 DIAGNOSIS — B009 Herpesviral infection, unspecified: Secondary | ICD-10-CM

## 2023-08-22 MED ORDER — VALACYCLOVIR HCL 500 MG PO TABS: 500.0000 mg | ORAL_TABLET | Freq: Every day | ORAL | 5 refills | Status: AC

## 2023-08-22 NOTE — Telephone Encounter (Signed)
 Rx eRxd by Crystal B.

## 2023-09-11 ENCOUNTER — Ambulatory Visit
Admission: RE | Admit: 2023-09-11 | Discharge: 2023-09-11 | Disposition: A | Discharge status: Home / Self Care | Source: Ambulatory Visit | Attending: Obstetrics and Gynecology | Admitting: Obstetrics and Gynecology

## 2023-09-11 DIAGNOSIS — Z1231 Encounter for screening mammogram for malignant neoplasm of breast: Secondary | ICD-10-CM | POA: Diagnosis present

## 2023-09-13 ENCOUNTER — Ambulatory Visit: Payer: Self-pay | Admitting: Obstetrics and Gynecology

## 2023-10-21 DIAGNOSIS — M25512 Pain in left shoulder: Secondary | ICD-10-CM | POA: Diagnosis not present

## 2023-10-26 ENCOUNTER — Encounter: Payer: Self-pay | Admitting: Obstetrics and Gynecology

## 2023-10-26 DIAGNOSIS — F32A Depression, unspecified: Secondary | ICD-10-CM

## 2023-10-26 MED ORDER — ESCITALOPRAM OXALATE 10 MG PO TABS: ORAL_TABLET | ORAL | 0 refills | Status: AC

## 2023-11-13 DIAGNOSIS — M25512 Pain in left shoulder: Secondary | ICD-10-CM | POA: Diagnosis not present

## 2023-11-15 DIAGNOSIS — D2371 Other benign neoplasm of skin of right lower limb, including hip: Secondary | ICD-10-CM | POA: Diagnosis not present

## 2023-11-15 DIAGNOSIS — L304 Erythema intertrigo: Secondary | ICD-10-CM | POA: Diagnosis not present

## 2023-11-15 DIAGNOSIS — L821 Other seborrheic keratosis: Secondary | ICD-10-CM | POA: Diagnosis not present

## 2023-11-15 DIAGNOSIS — D225 Melanocytic nevi of trunk: Secondary | ICD-10-CM | POA: Diagnosis not present

## 2023-11-29 DIAGNOSIS — Z1331 Encounter for screening for depression: Secondary | ICD-10-CM | POA: Diagnosis not present

## 2023-11-29 DIAGNOSIS — I10 Essential (primary) hypertension: Secondary | ICD-10-CM | POA: Diagnosis not present

## 2023-11-29 DIAGNOSIS — Z23 Encounter for immunization: Secondary | ICD-10-CM | POA: Diagnosis not present

## 2023-11-29 DIAGNOSIS — K649 Unspecified hemorrhoids: Secondary | ICD-10-CM | POA: Diagnosis not present
